# Patient Record
Sex: Male | Born: 1955 | Race: Black or African American | Hispanic: No | Smoking: Former smoker
Health system: Southern US, Community
[De-identification: ages and names within clinical notes are randomized; demographics above are authoritative.]

## PROBLEM LIST (undated history)

## (undated) DIAGNOSIS — E7800 Pure hypercholesterolemia, unspecified: Secondary | ICD-10-CM

## (undated) DIAGNOSIS — N529 Male erectile dysfunction, unspecified: Secondary | ICD-10-CM

## (undated) DIAGNOSIS — G5600 Carpal tunnel syndrome, unspecified upper limb: Secondary | ICD-10-CM

## (undated) DIAGNOSIS — I10 Essential (primary) hypertension: Secondary | ICD-10-CM

## (undated) DIAGNOSIS — K219 Gastro-esophageal reflux disease without esophagitis: Secondary | ICD-10-CM

## (undated) HISTORY — DX: Male erectile dysfunction, unspecified: N52.9

## (undated) HISTORY — PX: UMBILICAL HERNIA REPAIR: SHX196

## (undated) HISTORY — DX: Pure hypercholesterolemia, unspecified: E78.00

## (undated) HISTORY — DX: Gastro-esophageal reflux disease without esophagitis: K21.9

## (undated) HISTORY — DX: Carpal tunnel syndrome, unspecified upper limb: G56.00

## (undated) HISTORY — DX: Essential (primary) hypertension: I10

---

## 2002-07-09 ENCOUNTER — Encounter: Payer: Self-pay | Admitting: Emergency Medicine

## 2002-07-09 ENCOUNTER — Emergency Department (HOSPITAL_COMMUNITY): Admission: EM | Admit: 2002-07-09 | Discharge: 2002-07-09 | Payer: Self-pay | Admitting: Emergency Medicine

## 2002-07-20 ENCOUNTER — Emergency Department (HOSPITAL_COMMUNITY): Admission: EM | Admit: 2002-07-20 | Discharge: 2002-07-20 | Payer: Self-pay | Admitting: Emergency Medicine

## 2006-07-12 ENCOUNTER — Ambulatory Visit: Payer: Self-pay | Admitting: Internal Medicine

## 2006-07-22 ENCOUNTER — Ambulatory Visit: Payer: Self-pay | Admitting: Internal Medicine

## 2006-08-11 ENCOUNTER — Ambulatory Visit: Payer: Self-pay | Admitting: Internal Medicine

## 2006-09-29 ENCOUNTER — Ambulatory Visit (HOSPITAL_BASED_OUTPATIENT_CLINIC_OR_DEPARTMENT_OTHER): Admission: RE | Admit: 2006-09-29 | Discharge: 2006-09-29 | Payer: Self-pay | Admitting: Orthopedic Surgery

## 2006-11-23 HISTORY — PX: TOTAL HIP REVISION: SHX763

## 2007-02-24 ENCOUNTER — Encounter: Payer: Self-pay | Admitting: Internal Medicine

## 2007-02-24 ENCOUNTER — Ambulatory Visit: Payer: Self-pay | Admitting: Internal Medicine

## 2007-02-24 DIAGNOSIS — Z9889 Other specified postprocedural states: Secondary | ICD-10-CM

## 2007-02-24 DIAGNOSIS — K219 Gastro-esophageal reflux disease without esophagitis: Secondary | ICD-10-CM

## 2007-03-11 ENCOUNTER — Encounter: Payer: Self-pay | Admitting: Internal Medicine

## 2007-03-11 ENCOUNTER — Ambulatory Visit: Payer: Self-pay | Admitting: Internal Medicine

## 2007-03-25 ENCOUNTER — Ambulatory Visit: Payer: Self-pay | Admitting: Internal Medicine

## 2007-03-25 ENCOUNTER — Encounter: Payer: Self-pay | Admitting: *Deleted

## 2007-04-08 ENCOUNTER — Ambulatory Visit: Payer: Self-pay | Admitting: Internal Medicine

## 2007-04-08 ENCOUNTER — Encounter: Payer: Self-pay | Admitting: Internal Medicine

## 2007-04-15 ENCOUNTER — Encounter: Payer: Self-pay | Admitting: Internal Medicine

## 2007-06-13 ENCOUNTER — Ambulatory Visit: Payer: Self-pay | Admitting: Family Medicine

## 2007-06-14 LAB — CONVERTED CEMR LAB
BUN: 12 mg/dL (ref 6–23)
CO2: 26 meq/L (ref 19–32)
Calcium: 9.5 mg/dL (ref 8.4–10.5)
Chloride: 105 meq/L (ref 96–112)
Creatinine, Ser: 1 mg/dL (ref 0.4–1.5)
GFR calc Af Amer: 101 mL/min
GFR calc non Af Amer: 84 mL/min
Glucose, Bld: 91 mg/dL (ref 70–99)
Potassium: 4 meq/L (ref 3.5–5.1)
Sodium: 137 meq/L (ref 135–145)

## 2007-06-15 ENCOUNTER — Telehealth (INDEPENDENT_AMBULATORY_CARE_PROVIDER_SITE_OTHER): Payer: Self-pay | Admitting: Family Medicine

## 2007-06-15 ENCOUNTER — Encounter (INDEPENDENT_AMBULATORY_CARE_PROVIDER_SITE_OTHER): Payer: Self-pay | Admitting: *Deleted

## 2007-06-24 ENCOUNTER — Encounter: Admission: RE | Admit: 2007-06-24 | Discharge: 2007-06-24 | Payer: Self-pay | Admitting: Orthopedic Surgery

## 2007-06-28 ENCOUNTER — Telehealth (INDEPENDENT_AMBULATORY_CARE_PROVIDER_SITE_OTHER): Payer: Self-pay | Admitting: *Deleted

## 2007-07-11 ENCOUNTER — Telehealth (INDEPENDENT_AMBULATORY_CARE_PROVIDER_SITE_OTHER): Payer: Self-pay | Admitting: *Deleted

## 2007-07-20 ENCOUNTER — Ambulatory Visit: Payer: Self-pay | Admitting: Family Medicine

## 2007-07-22 ENCOUNTER — Telehealth (INDEPENDENT_AMBULATORY_CARE_PROVIDER_SITE_OTHER): Payer: Self-pay | Admitting: *Deleted

## 2007-08-11 ENCOUNTER — Ambulatory Visit: Payer: Self-pay | Admitting: Family Medicine

## 2007-08-11 LAB — CONVERTED CEMR LAB
Creatinine, Ser: 1.2 mg/dL (ref 0.4–1.5)
GFR calc non Af Amer: 68 mL/min
Glucose, Bld: 106 mg/dL — ABNORMAL HIGH (ref 70–99)
Potassium: 4.4 meq/L (ref 3.5–5.1)
Sodium: 141 meq/L (ref 135–145)

## 2007-08-15 ENCOUNTER — Telehealth (INDEPENDENT_AMBULATORY_CARE_PROVIDER_SITE_OTHER): Payer: Self-pay | Admitting: *Deleted

## 2007-08-22 ENCOUNTER — Telehealth (INDEPENDENT_AMBULATORY_CARE_PROVIDER_SITE_OTHER): Payer: Self-pay | Admitting: *Deleted

## 2007-08-26 ENCOUNTER — Ambulatory Visit: Payer: Self-pay | Admitting: Family Medicine

## 2007-09-09 ENCOUNTER — Ambulatory Visit: Payer: Self-pay | Admitting: Family Medicine

## 2007-09-12 ENCOUNTER — Ambulatory Visit: Payer: Self-pay | Admitting: Family Medicine

## 2007-09-13 ENCOUNTER — Telehealth (INDEPENDENT_AMBULATORY_CARE_PROVIDER_SITE_OTHER): Payer: Self-pay | Admitting: *Deleted

## 2007-09-13 ENCOUNTER — Encounter (INDEPENDENT_AMBULATORY_CARE_PROVIDER_SITE_OTHER): Payer: Self-pay | Admitting: Family Medicine

## 2007-09-13 LAB — CONVERTED CEMR LAB
BUN: 15 mg/dL (ref 6–23)
CO2: 30 meq/L (ref 19–32)
Creatinine, Ser: 1.2 mg/dL (ref 0.4–1.5)
GFR calc Af Amer: 82 mL/min
Potassium: 3.9 meq/L (ref 3.5–5.1)
Sodium: 143 meq/L (ref 135–145)

## 2007-09-19 ENCOUNTER — Inpatient Hospital Stay (HOSPITAL_COMMUNITY): Admission: RE | Admit: 2007-09-19 | Discharge: 2007-09-21 | Payer: Self-pay | Admitting: Orthopedic Surgery

## 2007-11-18 ENCOUNTER — Telehealth (INDEPENDENT_AMBULATORY_CARE_PROVIDER_SITE_OTHER): Payer: Self-pay | Admitting: *Deleted

## 2007-11-30 ENCOUNTER — Telehealth (INDEPENDENT_AMBULATORY_CARE_PROVIDER_SITE_OTHER): Payer: Self-pay | Admitting: *Deleted

## 2008-06-07 ENCOUNTER — Ambulatory Visit: Payer: Self-pay | Admitting: *Deleted

## 2008-06-07 DIAGNOSIS — E7800 Pure hypercholesterolemia, unspecified: Secondary | ICD-10-CM

## 2008-06-07 DIAGNOSIS — I10 Essential (primary) hypertension: Secondary | ICD-10-CM | POA: Insufficient documentation

## 2008-07-09 ENCOUNTER — Ambulatory Visit: Payer: Self-pay | Admitting: *Deleted

## 2008-07-09 DIAGNOSIS — F528 Other sexual dysfunction not due to a substance or known physiological condition: Secondary | ICD-10-CM | POA: Insufficient documentation

## 2008-07-18 ENCOUNTER — Telehealth (INDEPENDENT_AMBULATORY_CARE_PROVIDER_SITE_OTHER): Payer: Self-pay | Admitting: *Deleted

## 2008-07-31 IMAGING — CR DG PORTABLE PELVIS
1 series · 1 of 1 positions shown · non-contrast
Comparison: None.

CLINICAL DATA: Right hip replacement.  
 PORTABLE PELVIS ? 1 VIEW:

[view not recorded]
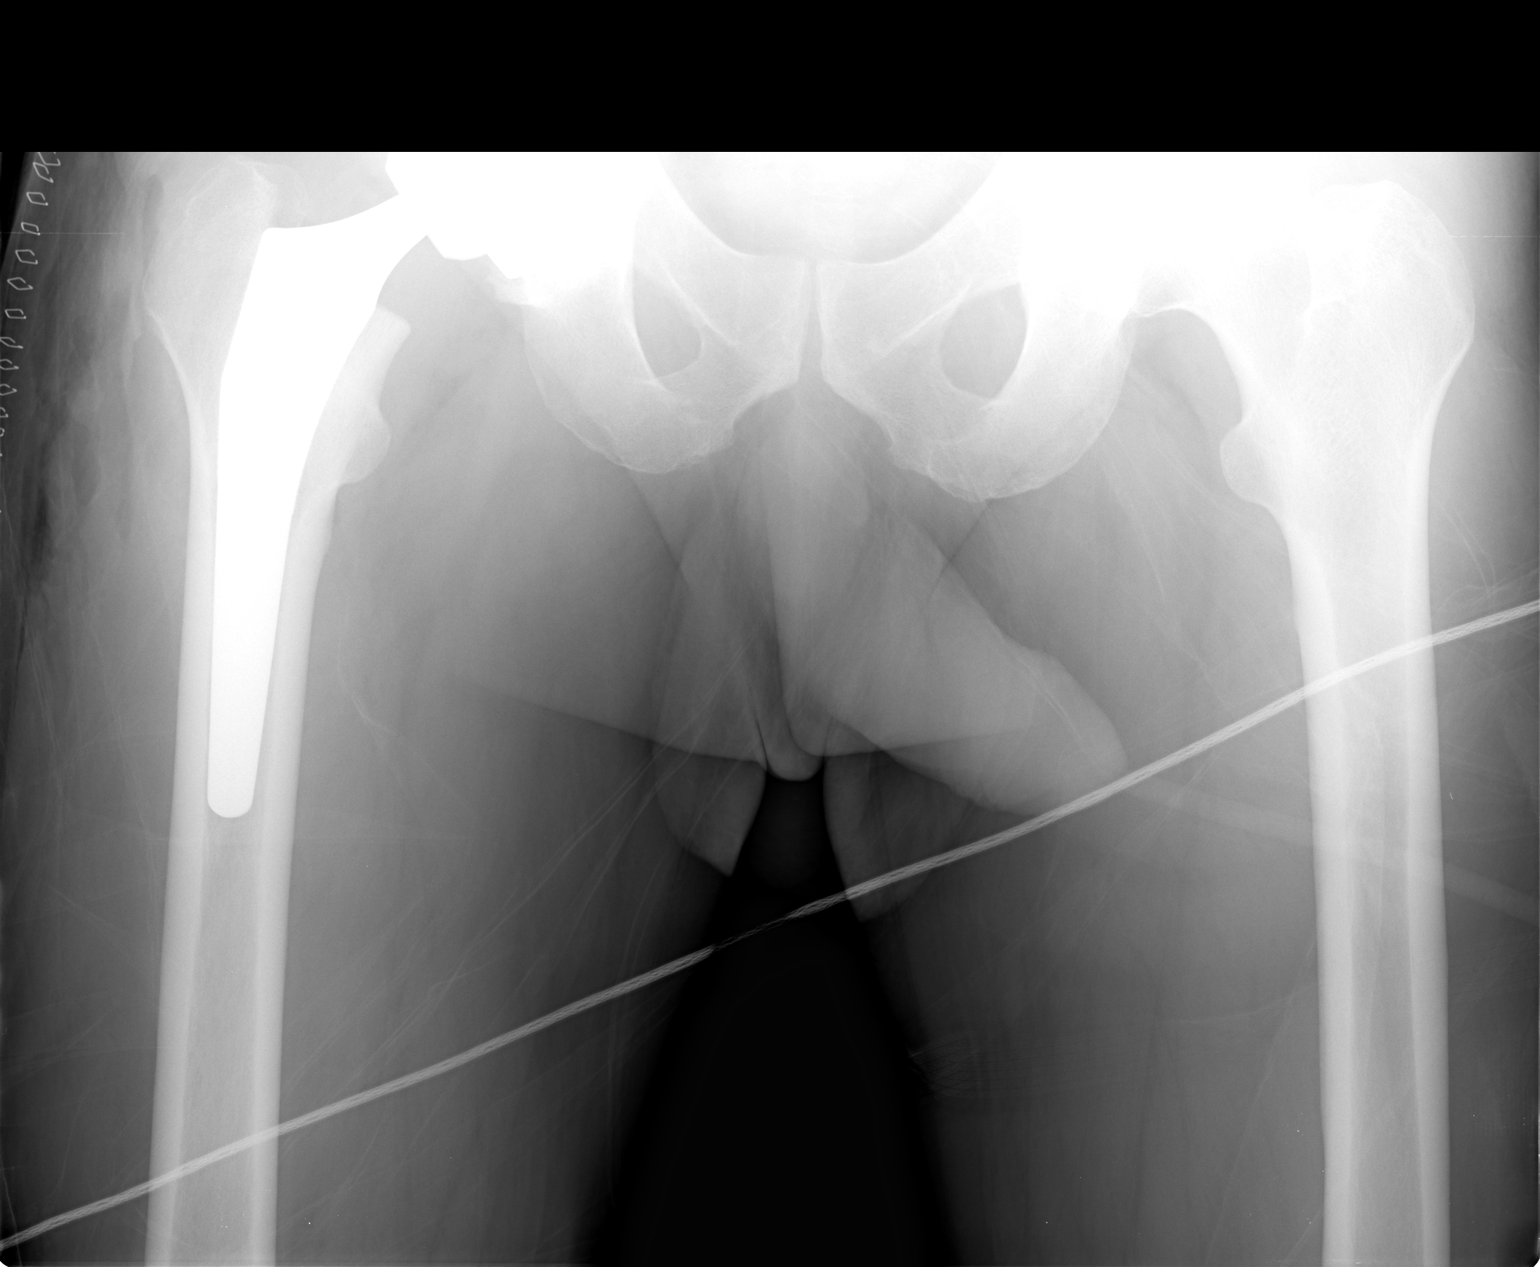

[1 of 1 positions shown; findings below may reference images not displayed]

FINDINGS: Patient is status-post right total hip arthroplasty.  Overlying subcutaneous emphysema.
IMPRESSION: Right total hip arthroplasty without immediate complicating feature.

## 2008-07-31 IMAGING — CR DG HIP 1V PORT*R*
1 series · 1 of 1 positions shown · non-contrast
Comparison: None.

CLINICAL DATA: Right hip replacement. 
 PORTABLE RIGHT HIP ? 1 VIEW:

[view not recorded]
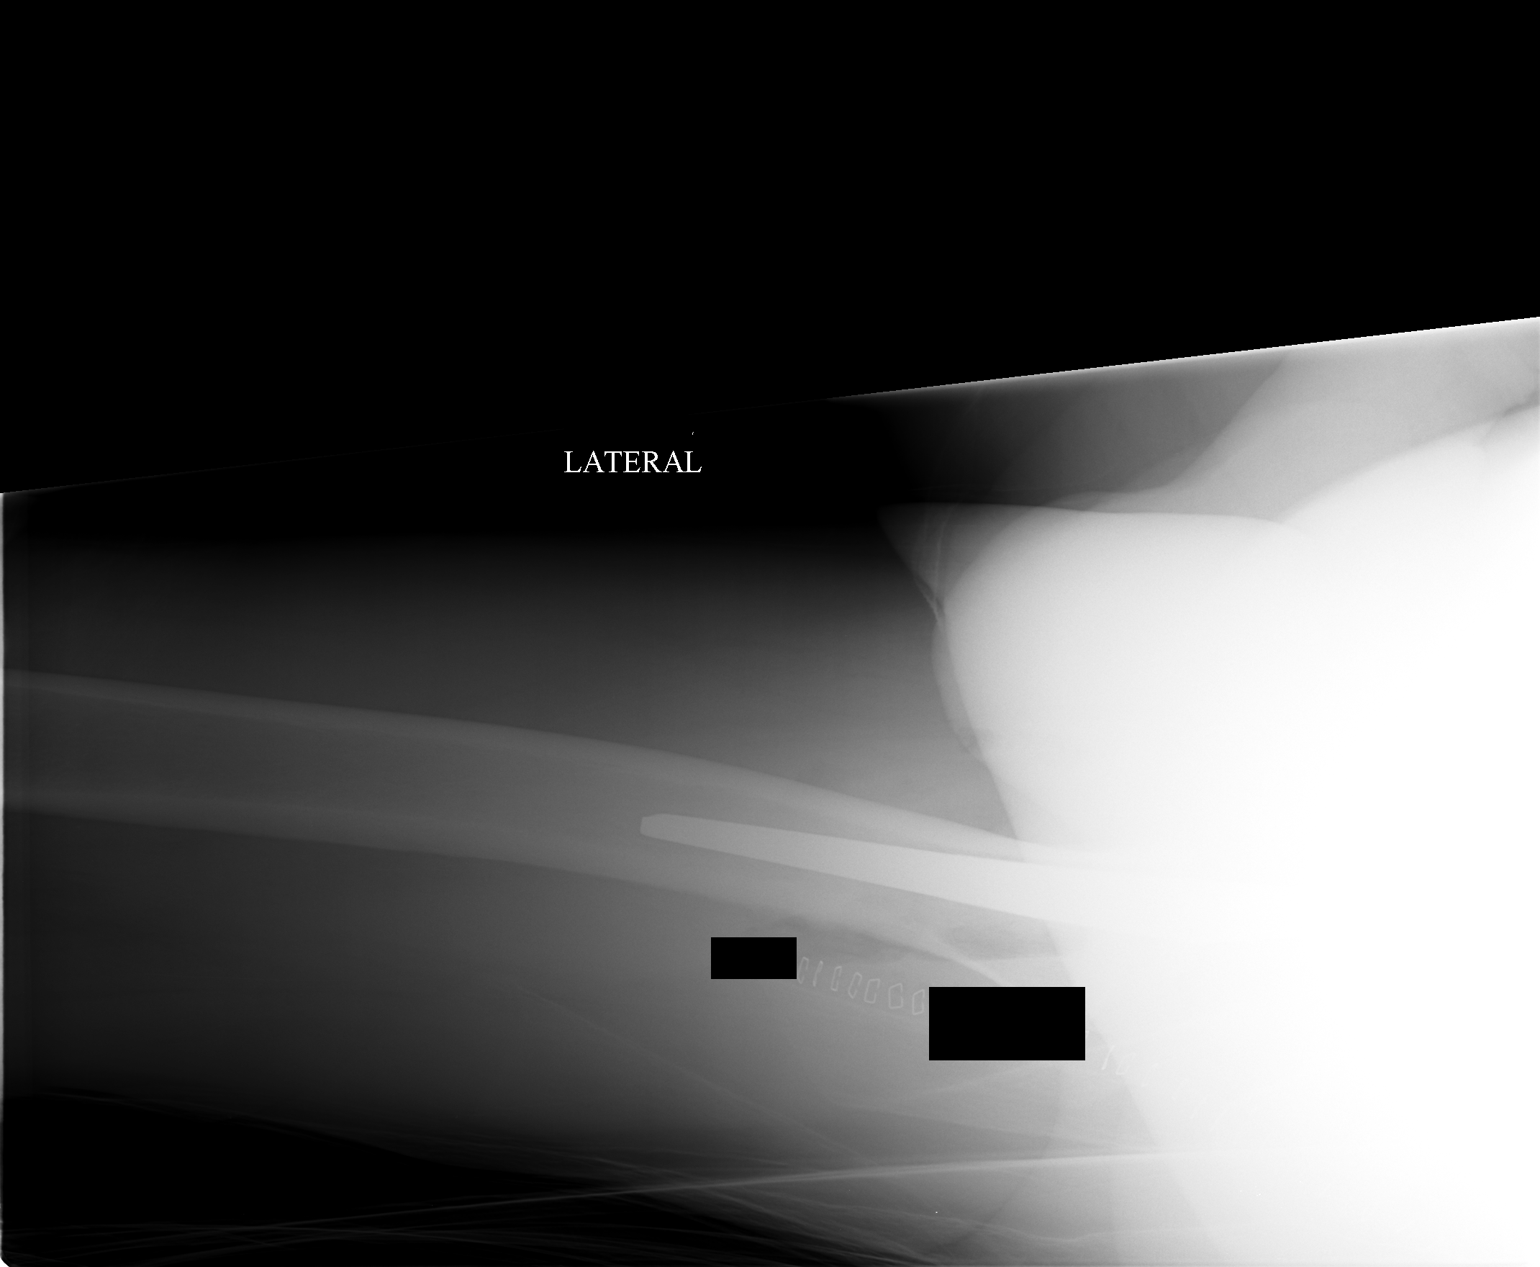

[1 of 1 positions shown; findings below may reference images not displayed]

FINDINGS: Patient is status-post right total hip arthroplasty.  The femoral stem is visualized with overlying subcutaneous emphysema.  The right hip is obscured by superimposed soft tissue.
IMPRESSION: Please see above.

## 2008-08-09 ENCOUNTER — Ambulatory Visit: Payer: Self-pay | Admitting: *Deleted

## 2008-10-12 ENCOUNTER — Ambulatory Visit: Payer: Self-pay | Admitting: *Deleted

## 2008-10-12 DIAGNOSIS — G5600 Carpal tunnel syndrome, unspecified upper limb: Secondary | ICD-10-CM

## 2008-11-21 ENCOUNTER — Ambulatory Visit: Payer: Self-pay | Admitting: *Deleted

## 2008-12-17 ENCOUNTER — Telehealth (INDEPENDENT_AMBULATORY_CARE_PROVIDER_SITE_OTHER): Payer: Self-pay | Admitting: *Deleted

## 2009-03-01 ENCOUNTER — Ambulatory Visit: Payer: Self-pay | Admitting: *Deleted

## 2009-03-01 LAB — CONVERTED CEMR LAB
ALT: 39 units/L (ref 0–53)
Albumin: 4.3 g/dL (ref 3.5–5.2)
Basophils Absolute: 0 10*3/uL (ref 0.0–0.1)
CO2: 28 meq/L (ref 19–32)
Calcium: 9.4 mg/dL (ref 8.4–10.5)
Chloride: 106 meq/L (ref 96–112)
Eosinophils Absolute: 0.1 10*3/uL (ref 0.0–0.7)
GFR calc non Af Amer: 81.3 mL/min (ref >60)
Glucose, Bld: 118 mg/dL — ABNORMAL HIGH (ref 70–99)
Lymphocytes Relative: 42.7 % (ref 12.0–46.0)
MCHC: 35 g/dL (ref 30.0–36.0)
Monocytes Relative: 9.2 % (ref 3.0–12.0)
Neutro Abs: 1.8 10*3/uL (ref 1.4–7.7)
Neutrophils Relative %: 45.8 % (ref 43.0–77.0)
Platelets: 191 10*3/uL (ref 150.0–400.0)
RDW: 13.2 % (ref 11.5–14.6)
Sodium: 142 meq/L (ref 135–145)
TSH: 1.74 microintl units/mL (ref 0.35–5.50)
Total Bilirubin: 1.6 mg/dL — ABNORMAL HIGH (ref 0.3–1.2)
Total CHOL/HDL Ratio: 5
Total Protein: 7.5 g/dL (ref 6.0–8.3)
Triglycerides: 148 mg/dL (ref 0.0–149.0)

## 2009-03-04 ENCOUNTER — Encounter: Payer: Self-pay | Admitting: Internal Medicine

## 2009-06-04 ENCOUNTER — Telehealth (INDEPENDENT_AMBULATORY_CARE_PROVIDER_SITE_OTHER): Payer: Self-pay | Admitting: *Deleted

## 2009-06-06 ENCOUNTER — Telehealth (INDEPENDENT_AMBULATORY_CARE_PROVIDER_SITE_OTHER): Payer: Self-pay | Admitting: *Deleted

## 2010-12-18 ENCOUNTER — Ambulatory Visit (INDEPENDENT_AMBULATORY_CARE_PROVIDER_SITE_OTHER)
Admission: RE | Admit: 2010-12-18 | Discharge: 2010-12-18 | Payer: BC Managed Care – PPO | Source: Home / Self Care | Attending: Internal Medicine | Admitting: Internal Medicine

## 2010-12-18 DIAGNOSIS — I10 Essential (primary) hypertension: Secondary | ICD-10-CM

## 2010-12-21 LAB — CONVERTED CEMR LAB
CO2: 28 meq/L (ref 19–32)
GFR calc Af Amer: 91 mL/min
GFR calc non Af Amer: 75 mL/min
Glucose, Bld: 102 mg/dL — ABNORMAL HIGH (ref 70–99)
Sodium: 140 meq/L (ref 135–145)

## 2011-01-08 NOTE — Assessment & Plan Note (Signed)
Summary: NEW TO EST [employer] /MHF   Vital Signs:  Patient profile:   55 year old male Height:      72.25 inches Weight:      249.50 pounds BMI:     33.73 O2 Sat:      99 % on Room air Temp:     98.6 degrees F oral Pulse rate:   66 / minute Resp:     18 per minute BP sitting:   176 / 80  (left arm)  Vitals Entered By: Glendell Docker CMA (December 18, 2010 1:13 PM)  O2 Flow:  Room air CC: New Patient  Is Patient Diabetic? No Pain Assessment Patient in pain? no      Comments rx for blood pressure medication, states he was without insurance for a year and could not afford his medication, so he has not had any medication for the past year blood pressure in left arm 168/100   Primary Care Provider:  Dondra Spry DO  CC:  New Patient .  History of Present Illness: 55 y/o male for f/u didnt have ins  in between jobs  ringing in the ears at first only bothered pt at night not getting worse denies sign change in hearing   Preventive Screening-Counseling & Management  Alcohol-Tobacco     Alcohol drinks/day: <1     Smoking Status: quit  Caffeine-Diet-Exercise     Caffeine use/day: 3-4 times per week     Does Patient Exercise: no  Allergies (verified): No Known Drug Allergies  Past History:  Past Medical History: HYPERCHOLESTEROLEMIA (ICD-272.0) GERD (ICD-530.81) HYPERTENSION - treated x 2008-2009 erectile dysfunction carpal tunnel  Past Surgical History: Total hip replacement 2008 Umbilical hernia as a baby   Social History: 5 children - grown Single - Divorced 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  nocturia x 2   Physical Exam  General:  alert and overweight-appearing.   Head:   normocephalic and atraumatic.   Eyes:  pupils equal, pupils round, and pupils reactive to light.   Ears:  R ear normal and L ear normal.   Mouth:  Oral mucosa and oropharynx without lesions or exudates.   Neck:  No deformities, masses, or tenderness noted. Lungs:  Normal respiratory effort, chest expands symmetrically. Lungs are clear to auscultation, no crackles or wheezes. Heart:  Normal rate and regular rhythm. S1 and S2 normal without gallop, murmur, click, rub or other extra sounds. Abdomen:  soft, non-tender, normal bowel sounds, no masses, no hepatomegaly, and no splenomegaly.   Extremities:  trace left pedal edema and trace right pedal edema.   Neurologic:  cranial nerves II-XII intact and gait normal.   Psych:  normally interactive, good eye contact, not anxious appearing, and not depressed appearing.     Impression & Recommendations:  Problem # 1:  HYPERTENSION (ICD-401.9)  hasn't taken meds x 1 year we discussed risks of untreated htn restart benicar/hct  The following medications were removed from the medication list:    Benicar Hct 40-25 Mg Tabs (Olmesartan medoxomil-hctz) .Marland Kitchen... 1 by mouth qd His updated medication list for this problem includes:    Benicar Hct 40-25 Mg Tabs (Olmesartan medoxomil-hctz) ..... One by mouth once daily  BP today: 176/80 Prior  BP: 118/80 (03/01/2009)  Labs Reviewed: K+: 4.2 (03/01/2009) Creat: : 1.2 (03/01/2009)   Chol: 230 (03/01/2009)   HDL: 45.10 (03/01/2009)   TG: 148.0 (03/01/2009)  Complete Medication List: 1)  Benicar Hct 40-25 Mg Tabs (Olmesartan medoxomil-hctz) .... One by mouth once daily  Patient Instructions: 1)  Please schedule a follow-up appointment in 1 month. 2)  BMP prior to visit, ICD-9:  401.9 3)  Hepatic Panel prior to visit, ICD-9: 272.4 4)  Lipid Panel prior to visit, ICD-9: 272.4 5)  TSH prior to visit, ICD-9: 401.9 6)  PSA prior to visit, ICD-9: v76.44 7)  HbgA1C prior to visit, ICD-9: 790.29 8)  Please  return for lab work one (1) week before your next appointment.                                                                              Prescriptions: BENICAR HCT 40-25 MG TABS (OLMESARTAN MEDOXOMIL-HCTZ) one by mouth once daily  #90 x 1   Entered and Authorized by:   D. Thomos Lemons DO   Signed by:   D. Thomos Lemons DO on 12/18/2010   Method used:   Print then Give to Patient   RxID:   [id number]    Orders Added: 1)  New Patient Level III [99203]   Immunization History:  Influenza Immunization History:    Influenza:  declined (12/11/2010)   Contraindications/Deferment of Procedures/Staging:    Test/Procedure: FLU VAX    Reason for deferment: patient declined   Immunization History:  Influenza Immunization History:    Influenza:  Declined (12/11/2010)  Current Allergies (reviewed today): No known allergies

## 2011-01-23 ENCOUNTER — Encounter: Payer: Self-pay | Admitting: Internal Medicine

## 2011-01-23 ENCOUNTER — Ambulatory Visit (INDEPENDENT_AMBULATORY_CARE_PROVIDER_SITE_OTHER): Payer: BC Managed Care – PPO | Admitting: Internal Medicine

## 2011-01-23 DIAGNOSIS — I10 Essential (primary) hypertension: Secondary | ICD-10-CM

## 2011-01-23 DIAGNOSIS — R7309 Other abnormal glucose: Secondary | ICD-10-CM | POA: Insufficient documentation

## 2011-01-23 LAB — CONVERTED CEMR LAB
ALT: 29 units/L (ref 0–53)
AST: 25 units/L (ref 0–37)
Bilirubin, Direct: 0.2 mg/dL (ref 0.0–0.3)
Calcium: 9.1 mg/dL (ref 8.4–10.5)
Cholesterol: 179 mg/dL (ref 0–200)
Glucose, Bld: 95 mg/dL (ref 70–99)
Indirect Bilirubin: 0.9 mg/dL (ref 0.0–0.9)
Potassium: 3.9 meq/L (ref 3.5–5.3)
Sodium: 140 meq/L (ref 135–145)
TSH: 2.05 microintl units/mL (ref 0.350–4.500)
Total CHOL/HDL Ratio: 4.7
Total Protein: 7.5 g/dL (ref 6.0–8.3)
Triglycerides: 145 mg/dL (ref <150)
VLDL: 29 mg/dL (ref 0–40)

## 2011-01-26 ENCOUNTER — Telehealth: Payer: Self-pay | Admitting: Internal Medicine

## 2011-01-26 ENCOUNTER — Encounter: Payer: Self-pay | Admitting: Internal Medicine

## 2011-01-27 ENCOUNTER — Encounter: Payer: Self-pay | Admitting: Internal Medicine

## 2011-01-29 ENCOUNTER — Ambulatory Visit (INDEPENDENT_AMBULATORY_CARE_PROVIDER_SITE_OTHER): Payer: BC Managed Care – PPO | Admitting: Internal Medicine

## 2011-01-29 ENCOUNTER — Encounter: Payer: Self-pay | Admitting: Internal Medicine

## 2011-01-29 DIAGNOSIS — M2550 Pain in unspecified joint: Secondary | ICD-10-CM | POA: Insufficient documentation

## 2011-01-29 DIAGNOSIS — M79609 Pain in unspecified limb: Secondary | ICD-10-CM

## 2011-01-29 LAB — CONVERTED CEMR LAB: Total CK: 237 units/L — ABNORMAL HIGH (ref 7–232)

## 2011-01-30 ENCOUNTER — Telehealth: Payer: Self-pay | Admitting: Internal Medicine

## 2011-02-03 NOTE — Progress Notes (Signed)
Summary: Lab results  Phone Note Outgoing Call   Summary of Call: call pt - labs normal  (muscle enzymes, sed rate - marker of inflammation) Initial call taken by: D. Thomos Lemons DO,  January 30, 2011 8:21 AM  Follow-up for Phone Call        call placed to patient at [phone], he was informed per Dr Artist Pais instructions. Follow-up by: Glendell Docker CMA,  January 30, 2011 8:55 AM

## 2011-02-03 NOTE — Letter (Signed)
   [facility] [address] [place], [place]  [address]  [place] Phone: [phone]      January 26, 2011   [facility] [address] [place], [place] [address]  RE:  LAB RESULTS  Dear  Mr. Abed,  The following is an interpretation of your most recent lab tests.  Please take note of any instructions provided or changes to medications that have resulted from your lab work.  PSA:  normal - no follow-up needed PSA: 1.82  ELECTROLYTES:  Good - no changes needed  KIDNEY FUNCTION TESTS:  Good - no changes needed  LIVER FUNCTION TESTS:  Good - no changes needed  LIPID PANEL:  Fair - review at your next visit Triglyceride: 145   Cholesterol: 179   LDL: 112   HDL: 38   Chol/HDL%:  4.7 Ratio  THYROID STUDIES:  Thyroid studies normal TSH: 2.050     DIABETIC STUDIES:  Fair - schedule a follow-up appointment Blood Glucose: 95   HgbA1C: 6.4          Sincerely Yours,    Dr. Thomos Lemons  Appended Document:  mailed

## 2011-02-03 NOTE — Progress Notes (Signed)
Summary: Lab Results  Phone Note Outgoing Call   Summary of Call: call pt - blood test shows pt is borderline diabetic.  please advise patient to avoid concentrated sweets (soft drinks, fruit juices, desserts) he should try to limit his carbohydrate intake to 30 g per meal I will send letter re:  other lab results  please ask pt whether he would like to see diabetic educator Initial call taken by: D. Thomos Lemons DO,  January 26, 2011 1:00 PM  Follow-up for Phone Call        call placed to patient at [phone], he was informed per Dr Artist Pais instructions. Patient states that he does not really eat any sweets, and he is interested in seeing a diabetes educator Follow-up by: Glendell Docker CMA,  January 27, 2011 8:55 AM  Additional Follow-up for Phone Call Additional follow up Details #1::        patient aware he will be contact regarding referral to diabetes educator Additional Follow-up by: Glendell Docker CMA,  January 27, 2011 2:53 PM

## 2011-02-10 NOTE — Letter (Signed)
Summary: Order for Nutrition & Diabetes Services  Order for Nutrition & Diabetes Services   Imported By: Maryln Gottron 02/05/2011 13:07:04  _____________________________________________________________________  External Attachment:    Type:   Image     Comment:   External Document

## 2011-02-10 NOTE — Assessment & Plan Note (Signed)
Summary: 1 month follow up/mhf   Vital Signs:  Patient profile:   55 year old male Height:      72.25 inches Weight:      244 pounds BMI:     32.98 O2 Sat:      99 % on Room air Temp:     98.4 degrees F oral Pulse rate:   65 / minute Resp:     18 per minute BP sitting:   142 / 80  (right arm) Cuff size:   large  Vitals Entered By: Glendell Docker CMA (January 23, 2011 3:08 PM)  O2 Flow:  Room air CC: 1 Month Follow up Is Patient Diabetic? No Pain Assessment Patient in pain? no      Comments fasting for blood work , c/o body aches and leg discomfort since starting blood pressure medication   Primary Care Provider:  Dondra Spry DO  CC:  1 Month Follow up.  History of Present Illness: 55 y/o male for f/u re: htn complains of body aches not sure if symptoms related to BP medication pt on benicar /hctz     Allergies (verified): No Known Drug Allergies  Past History:  Past Medical History: HYPERCHOLESTEROLEMIA (ICD-272.0) GERD (ICD-530.81) HYPERTENSION - treated x 2008-2009 erectile dysfunction   carpal tunnel  Past Surgical History: Total hip replacement 2008 Umbilical hernia as a baby    Physical Exam  General:  alert, well-developed, and well-nourished.   Lungs:  Normal respiratory effort, chest expands symmetrically. Lungs are clear to auscultation, no crackles or wheezes. Heart:  Normal rate and regular rhythm. S1 and S2 normal without gallop, murmur, click, rub or other extra sounds. Neurologic:  cranial nerves II-XII intact and gait normal.     Impression & Recommendations:  Problem # 1:  HYPERTENSION (ICD-401.9) Assessment Improved  muscle aches, cramps may be from diuretic.  change to azor  The following medications were removed from the medication list:    Benicar Hct 40-25 Mg Tabs (Olmesartan medoxomil-hctz) ..... One by mouth once daily His updated medication list for this problem includes:    Azor 5-40 Mg Tabs (Amlodipine-olmesartan)  ..... One by mouth pd  Orders: T-Basic Metabolic Panel [id number]) T-Hepatic Function [id number]) T-Lipid Profile [id number]) T-TSH [phone])  BP today: 142/80 Prior BP: 176/80 (12/18/2010)  Labs Reviewed: K+: 4.2 (03/01/2009) Creat: : 1.2 (03/01/2009)   Chol: 230 (03/01/2009)   HDL: 45.10 (03/01/2009)   TG: 148.0 (03/01/2009)  Complete Medication List: 1)  Prevacid 24hr 15 Mg Cpdr (Lansoprazole) .... Take 1 capsule by mouth once a day 2)  Fish Oil 1000 Mg Caps (Omega-3 fatty acids) .... Take 1 capsule by mouth once a day 3)  Azor 5-40 Mg Tabs (Amlodipine-olmesartan) .... One by mouth pd 4)  Hydrocodone-acetaminophen 5-500 Mg Tabs (Hydrocodone-acetaminophen) .... One to two tabs by mouth two times a day as needed  Other Orders: T- Hemoglobin A1C ([id number]) T-PSA ([id number])  Patient Instructions: 1)  Please schedule a follow-up appointment in 6 weeks. Prescriptions: AZOR 5-40 MG TABS (AMLODIPINE-OLMESARTAN) one by mouth pd  #90 x 1   Entered and Authorized by:   D. Thomos Lemons DO   Signed by:   D. Thomos Lemons DO on 01/23/2011   Method used:   Print then Give to Patient   RxID:   [id number]    Orders Added: 1)  T-Basic Metabolic Panel [[id number]] 2)  T-Hepatic Function [[id number]] 3)  T-Lipid Profile [[id number]] 4)  T- Hemoglobin A1C [83036-23375] 5)  T-TSH [28315-17616]  6)  T-PSA [[address]] 7)  Est. Patient Level III [09811]    Current Allergies (reviewed today): No known allergies

## 2011-02-11 ENCOUNTER — Encounter: Payer: BC Managed Care – PPO | Attending: Internal Medicine | Admitting: *Deleted

## 2011-02-11 DIAGNOSIS — Z713 Dietary counseling and surveillance: Secondary | ICD-10-CM | POA: Insufficient documentation

## 2011-02-11 DIAGNOSIS — R7309 Other abnormal glucose: Secondary | ICD-10-CM | POA: Insufficient documentation

## 2011-02-19 NOTE — Assessment & Plan Note (Signed)
Summary: body aches/mhf   Vital Signs:  Patient profile:   55 year old male Height:      72.25 inches O2 Sat:      98 % on Room air Temp:     98.6 degrees F oral Pulse rate:   70 / minute Resp:     20 per minute BP sitting:   146 / 86  (right arm) Cuff size:   large  Vitals Entered By: Glendell Docker CMA (January 29, 2011 9:58 AM)  O2 Flow:  Room air CC: Joint aches Is Patient Diabetic? No Pain Assessment Patient in pain? no        Primary Care Provider:  Dondra Spry DO  CC:  Joint aches.  History of Present Illness: 55 y/o male c/o feeling achey symptoms worse on left side,  particularly left leg feels AM stiffness sometimes worse at night,  unable to sleep sometimes due to pain no joint swelling or redness  hx of right hip replacement no fevers    Preventive Screening-Counseling & Management  Alcohol-Tobacco     Smoking Status: quit  Allergies (verified): No Known Drug Allergies  Past History:  Past Medical History: HYPERCHOLESTEROLEMIA (ICD-272.0) GERD (ICD-530.81) HYPERTENSION - treated x 2008-2009 erectile dysfunction carpal tunnel    Past Surgical History: Total hip replacement 2008 Umbilical hernia as a baby    Physical Exam  General:  alert, well-developed, and well-nourished.   Lungs:  normal respiratory effort and normal breath sounds.   Heart:  normal rate, regular rhythm, and no gallop.   Msk:  no joint tenderness, no joint swelling, no joint warmth, and no redness over joints.    mild tenderness of bilateral deltoids Extremities:  trace left pedal edema and trace right pedal edema.   Neurologic:  cranial nerves II-XII intact and DTRs symmetrical and normal.     Impression & Recommendations:  Problem # 1:  LEG PAIN, LEFT (ICD-729.5) pt c/o bilateral lower ext pain.  left is much worse than right I suspect pain may be attributable to lumbar radiculopathy  use pain meds and perform stretching exercises as directed if  symptoms do not improve, consider MRI of LS spine  Problem # 2:  ARTHRALGIA (ICD-719.40) pt also c/o mild bilateral shoulder pain rule out rhabdo rule out PMR  Orders: T-CK Total ([id number]) T-Sed Rate (Automated) (09811-91478)  Problem # 3:  HYPERTENSION (ICD-401.9)  His updated medication list for this problem includes:    Azor 5-40 Mg Tabs (Amlodipine-olmesartan) ..... One by mouth pd  Complete Medication List: 1)  Prevacid 24hr 15 Mg Cpdr (Lansoprazole) .... Take 1 capsule by mouth once a day 2)  Fish Oil 1000 Mg Caps (Omega-3 fatty acids) .... Take 1 capsule by mouth once a day 3)  Azor 5-40 Mg Tabs (Amlodipine-olmesartan) .... One by mouth pd 4)  Hydrocodone-acetaminophen 5-500 Mg Tabs (Hydrocodone-acetaminophen) .... One to two tabs by mouth two times a day as needed  Patient Instructions: 1)  Please schedule a follow-up appointment in 1 month. 2)  Call our office if your symptoms do not  improve or gets worse. Prescriptions: HYDROCODONE-ACETAMINOPHEN 5-500 MG TABS (HYDROCODONE-ACETAMINOPHEN) one to two tabs by mouth two times a day as needed  #60 x 0   Entered and Authorized by:   D. Thomos Lemons DO   Signed by:   D. Thomos Lemons DO on 01/29/2011   Method used:   Print then Give to Patient   RxID:   [id number]  Orders Added: 1)  T-CK Total [82550-23250] 2)  T-Sed Rate (Automated) [44010-27253] 3)  Est. Patient Level III [66440]    Current Allergies (reviewed today): No known allergies

## 2011-03-12 ENCOUNTER — Telehealth: Payer: Self-pay | Admitting: *Deleted

## 2011-03-12 NOTE — Telephone Encounter (Signed)
Left message on machine for pt to return my call  

## 2011-03-12 NOTE — Telephone Encounter (Signed)
Received call from pt stating the Azor is too expensive, cost with insurance is $102. Pt is requesting a cheaper alternative. He is out of med and started back on Benicar HCT that he had left over. Please advise.

## 2011-03-12 NOTE — Telephone Encounter (Signed)
Please send separate rx for benicar 40 mg and amlodipine 5 mg

## 2011-03-13 ENCOUNTER — Ambulatory Visit: Payer: BC Managed Care – PPO | Admitting: Internal Medicine

## 2011-03-14 ENCOUNTER — Encounter: Payer: Self-pay | Admitting: Internal Medicine

## 2011-03-17 ENCOUNTER — Encounter: Payer: Self-pay | Admitting: Internal Medicine

## 2011-03-17 ENCOUNTER — Ambulatory Visit (INDEPENDENT_AMBULATORY_CARE_PROVIDER_SITE_OTHER): Payer: BC Managed Care – PPO | Admitting: Internal Medicine

## 2011-03-17 ENCOUNTER — Telehealth: Payer: Self-pay | Admitting: *Deleted

## 2011-03-17 VITALS — BP 128/80 | HR 63 | Temp 98.3°F | Resp 18 | Wt 243.0 lb

## 2011-03-17 DIAGNOSIS — I10 Essential (primary) hypertension: Secondary | ICD-10-CM

## 2011-03-17 DIAGNOSIS — R7309 Other abnormal glucose: Secondary | ICD-10-CM

## 2011-03-17 DIAGNOSIS — F528 Other sexual dysfunction not due to a substance or known physiological condition: Secondary | ICD-10-CM

## 2011-03-17 MED ORDER — AMLODIPINE BESYLATE 5 MG PO TABS: 5.0000 mg | ORAL_TABLET | Freq: Every day | ORAL | Status: DC

## 2011-03-17 MED ORDER — OLMESARTAN MEDOXOMIL 40 MG PO TABS: 40.0000 mg | ORAL_TABLET | Freq: Every day | ORAL | Status: DC

## 2011-03-17 MED ORDER — TADALAFIL 5 MG PO TABS: 5.0000 mg | ORAL_TABLET | Freq: Every day | ORAL | Status: DC | PRN

## 2011-03-17 NOTE — Telephone Encounter (Signed)
Pt notified and Rxs sent to pharmacy.

## 2011-03-17 NOTE — Telephone Encounter (Signed)
Please fax an order for labs to [facility] for July 17 appt for bmet a1c 790.29 401.9

## 2011-03-17 NOTE — Patient Instructions (Signed)
Please complete the following lab tests before your next follow up appointment: BMET, A1c - 790.29, 401.9 

## 2011-03-17 NOTE — Progress Notes (Signed)
  Subjective:    Patient ID: Joshua Little, male    DOB: 08-15-1956, 55 y.o.   MRN: [id number]  HPI 55 y/o male for follow up re:  htn Good med compliance but azor cost prohibitive Prev issues with myalgia resolved.  Likely due to transient viral infection    Review of Systems Negative for chest pain.  No dizziness  Past Medical History  Diagnosis Date  . Hypercholesterolemia   . GERD (gastroesophageal reflux disease)   . Hypertension   . ED (erectile dysfunction)   . Carpal tunnel syndrome     History   Social History  . Marital Status: Divorced    Spouse Name: N/A    Number of Children: N/A  . Years of Education: N/A   Occupational History  . Not on file.   Social History Main Topics  . Smoking status: Former Games developer  . Smokeless tobacco: Not on file   Comment: quit smoking in 2000-light smoker x 10 years  . Alcohol Use: Not on file  . Drug Use: Not on file  . Sexually Active: Not on file   Other Topics Concern  . Not on file   Social History Narrative   5 children - grownSingle -DivorcedOccupation: maintenance workerquit smoking 2000 - light smoker x 10 yrsAlcohol use-yes (3-4 drinks per week)Occupation: Maintenance supervisor for [employer]Caffeine use/day:  3-4 times per week    Past Surgical History  Procedure Date  . 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 Umbilical hernia repair     as a baby    Family History  Problem Relation Age of Onset  . Gout Father   . Diabetes Sister   . Other      no history of colon cancer, prostate cancer, coronary artery disease    No Known Allergies  Current Outpatient Prescriptions on File Prior to Visit  Medication Sig Dispense Refill  . amLODipine (NORVASC) 5 MG tablet Take 1 tablet (5 mg total) by mouth daily.  30 tablet  1  . HYDROcodone-acetaminophen (VICODIN) 5-500 MG per tablet Take 1 tablet by mouth 2 (two) times daily as needed.        . lansoprazole (PREVACID 24HR) 15 MG  capsule Take 15 mg by mouth daily.        Marland Kitchen olmesartan (BENICAR) 40 MG tablet Take 1 tablet (40 mg total) by mouth daily.  30 tablet  1  . Omega-3 Fatty Acids (FISH OIL) 1000 MG CAPS Take 1,000 mg by mouth daily.        Marland Kitchen DISCONTD: amLODipine-olmesartan (AZOR) 5-40 MG per tablet Take 1 tablet by mouth daily.          BP 128/80  Pulse 63  Temp(Src) 98.3 F (36.8 C) (Oral)  Resp 18  Wt 243 lb (110.224 kg)  SpO2 99%       Objective:   Physical Exam    Constitutional: Appears well-developed and well-nourished. No distress.  Cardiovascular: Normal rate, regular rhythm and normal heart sounds.  Exam reveals no gallop and no friction rub.   No murmur heard. Pulmonary/Chest: Effort normal and breath sounds normal.  No wheezes. No rales.  Psychiatric: Normal mood and affect. Behavior is normal.    Assessment & Plan:

## 2011-03-18 ENCOUNTER — Encounter: Payer: Self-pay | Admitting: *Deleted

## 2011-03-18 NOTE — Telephone Encounter (Signed)
Orders placed for [facility]

## 2011-04-07 NOTE — Op Note (Signed)
[name], Joshua [name]             ACCOUNT NO.:  [id number]   MEDICAL RECORD NO.:  [id number]          PATIENT TYPE:  INP   LOCATION:  5032                         FACILITY:  [facility]   PHYSICIAN:  Loreta Ave, M.D. DATE OF BIRTH:  [birth year]   DATE OF PROCEDURE:  09/19/2007  DATE OF DISCHARGE:                               OPERATIVE REPORT   PREOPERATIVE DIAGNOSIS:  End-stage degenerative arthritis, right hip.   POSTOPERATIVE DIAGNOSIS:  End-stage degenerative arthritis, right hip,  with marked collapse of the femoral head.   PROCEDURE:  Right total hip replacement, Stryker Accolade prosthesis.  Press-Fit 58-mm TSL acetabular cup screw fixation x2.  A ceramic liner  36-mm internal diameter.  Press-Fit humeral component with a 4.5 stem,  127-degree neck angle and a 36 +0 ceramic head.   SURGEON:  Mckinley Jewel, M.D.   ASSISTANT:  Zonia Kief, P.A., present throughout the entire case.   ANESTHESIA:  General.   BLOOD LOSS:  200 mL.  Blood given none.   SPECIMENS:  None.   CULTURES:  Normal.   COMPLICATIONS:  None.   PROCEDURE:  Soft compressive with abduction pillow.   PROCEDURE:  The patient was brought to the operating room and placed on  operating table in supine position.  After adequate anesthesia by  obtained, turned to a lateral position right side up.  Prepped and  draped in the usual sterile fashion.  Incision along the shaft of femur  to the trochanter and then extending posterior to superior.  Skin and  subcutaneous tissues divided.  Iliotibial band exposed, incised,  Charnley retractor put in place.  Neurovascular structures identified  and protected.  External rotator and capsule taken down off the back of  the intertrochanteric groove on the femur and tagged with a FiberWire.  The hip was dislocated posteriorly.  The femoral neck cut one  fingerbreadth above the lesser trochanter in line with definitive  prosthesis.  Acetabulum exposed.  Marked grade 4  changes throughout.  Very redundant soft tissue and labrum all taken down.  Acetabulum well  identified.  Appropriate retractors in place.  Reamed up to good  bleeding bone for 58-mm component.  The component was hammered in place  with 45 degrees of abduction and 25 of anteversion.  Excellent capturing  and fixation.  Augmented with two screws through the cup.  A 36-mm  internal diameter ceramic liner was inserted after we had done  appropriate trials.  Attention was turned to the femur.  Sequential  reaming up for good fitting proximally and distally.  I utilized a 4.5  component, restoring normal anteversion.  Good capturing fixation  alignment.  With the +0 x 36-mm head, I had good restoration of leg  lengths with excellent stability in flexion and extension.  After all  trials, definitive component was seen the femur.  The head attached.  The hip reduced.  I was very pleased with restoration of leg lengths and  stability.  Wound irrigated.  The external rotator and capsule repaired  at the back of the intertrochanteric groove through drill holes and the  FiberWire tied over  bony bridge.  Retractor removed.  The iliotibial  band closed with #1 Vicryl, skin and subcutaneous tissue with Vicryl and  staples.  Margins were injected with Marcaine.  Sterile compressive  dressing applied.  Returned to the supine position.  Anesthesia  reversed.  Brought to the recovery room.  Tolerated the surgery well  with no complications.      Loreta Ave, M.D.  Electronically Signed     DFM/MEDQ  D:  09/19/2007  T:  09/20/2007  Job:  [id number]

## 2011-04-07 NOTE — Assessment & Plan Note (Signed)
[facility]                                 ON-CALL NOTE   NAME:[name], [name]                      MRN:          [id number]  DATE:08/06/2007                            DOB:          [birth year]    Patient of Dr. Janese Banks called, but the patient's name is Joshua [name] Something  that did not come through on the pager.  His phone number is [phone].  The patient is due for a hip replacement by Dr. Eulah Pont and was given  pain medicines, Darvocet and ibuprofen.  However, it is not helping his  pain this weekend.  He is becoming more severe.  He has no associated  fever or new injury.  Have recommended he call Dr. Greig Right answering  service and have them manage what should be done about his increasing  pain and medications.     Neta Mends. Panosh, MD  Electronically Signed    WKP/MedQ  DD: 08/06/2007  DT: 08/07/2007  Job #: [id number]

## 2011-04-08 NOTE — Assessment & Plan Note (Signed)
Good response to cialis

## 2011-04-08 NOTE — Assessment & Plan Note (Signed)
BP: 128/80 mmHg   Well controlled Change azor to benicar and amlodipine due cost reasons Lab Results  Component Value Date   CREATININE 1.27 01/23/2011

## 2011-04-10 NOTE — Op Note (Signed)
NAME:  Joshua Little, Joshua Little             ACCOUNT NO.:  [id number]   MEDICAL RECORD NO.:  [id number]          PATIENT TYPE:  AMB   LOCATION:  DSC                          FACILITY:  [facility]   PHYSICIAN:  Harvie Junior, M.D.   DATE OF BIRTH:  [birth year]   DATE OF PROCEDURE:  DATE OF DISCHARGE:                                 OPERATIVE REPORT   PREOPERATIVE DIAGNOSIS:  Medial meniscal tear with suspected chondral  injury.   POSTPROCEDURE DIAGNOSES:  1. Posterior horn medial meniscal tear.  2. Medial condylar flap with no bone on the back side, large probably 12 x      15 mm.  3. Chondromalacia of the patella, grade 2 and grade 3.   PROCEDURE:  1. Partial posterior medial meniscectomy.  2. Chondroplasty medial femoral condyle with drilling of medial femoral      condyle with vascular enhancement awls.  3. Debridement of the chondromalacia and patella.   SURGEON:  Harvie Junior, M.D.   ASSISTANT:  Marshia Ly, P.A.   ANESTHESIA:  General.   BRIEF HISTORY:  The patient is a 55 year old male with long history of  having had pain and swelling in the knee.  He has been aspirated and  injected on a couple of occasions.  He continued to have complaints of  catching, locking, grabbing pain.  We evaluated him and felt that he had a  meniscal tear with possible chondral injury.  We talked about treatment  options but ultimately felt that arthroscopic intervention to be the most  appropriate course of action.  Briefly discussed MRI.  We felt that it  probably would not add much and given his long duration of symptoms, that  arthroscopy was appropriate.  He is brought to the operating room and wishes  to proceed with procedure.   PROCEDURE:  The patient brought to the operating room and after adequate  anesthesia was obtained with general anesthetic and placed on the operating  room table.  The right leg was prepped and draped in the usual sterile  fashion.  Following this, routine  arthroscopic examination of the knee  revealed there was obvious posterior horn medial meniscal tear.  This was  debrided back to a smooth and stable rim.   We encountered a large medial condylar flap on introduction into the medial  compartment.  This was probably 12 x 15 mm.  There was no bone on the back  side of this flap.  It was completely lose.  The probe could actually bring  it down into the joint.  Following debridement of the posterior horn, the  meniscus area was debrided and there was no exposed bone but given the  nature of the debridement and the high walls for the lesions, that vascular  enhancement drilling was appropriate and microfracture technique was  performed on the medial femoral condyle.   ACL was evaluated and noted to be normal.  Lateral side normal.  Patellofemoral joint shows some grade 2 and 3 changes and this was debrided  back to a smooth and stable rim.  Final check was made for  any lose and  fragmented  pieces and seeing none, the knee was copiously irrigated and suctioned dry.  Arthroscopic portals was closed with a bandage and sterile compression  dressing was applied.  The patient was taken to recovery.  He was noted to  be in satisfactory condition.  Estimated blood loss for procedure was none.      Harvie Junior, M.D.  Electronically Signed     JLG/MEDQ  D:  09/29/2006  T:  09/30/2006  Job:  [id number]

## 2011-04-10 NOTE — Assessment & Plan Note (Signed)
[facility]                          [place] OFFICE NOTE   NAME:Mikelson, KAWON                      MRN:          [id number]  DATE:07/12/2006                            DOB:          07-Jun-1956    NEW PATIENT HISTORY AND PHYSICAL:   CHIEF COMPLAINT:  Leg pain.   HISTORY OF PRESENT ILLNESS:  Mr. Ryser is a 55 year old gentleman who  came to the office for the first time to get established.  For several  months, he has noticed right leg pain, usually at the right knee on the  lateral side of his thigh.  The pain is worse when he walks, and he feels  sometimes that his leg gives out.  He denies any back pain, calf pain, knee  swelling.  He has not taken anything over-the-counter to help the pain  except Aleve.  Also, for years, he has noticed acid reflux described as a  burning feeling in the chest and throat, mostly at night.  Denies  odynophagia, dysphasia.  Has tried Zantac without any help.  On today's  exam, we noticed the blood pressure to be high.  He was unaware of that.  In  fact, he had never been told he has hypertension.  He has not checked his  blood pressure in a long time.   PAST MEDICAL HISTORY:  Basically negative.   FAMILY HISTORY:  1. Father has gout.  2. Mother has a history of clots, and she is on Coumadin.  3. Sister has diabetes.  4. No history of colon or prostate cancer.  No history of heart attacks.   SOCIAL HISTORY:  The patient is single.  Has 5 kids.  Quit tobacco in 2000.  Drinks only socially.   REVIEW OF SYSTEMS:  Per the history of present illness.  In addition, he  denies any headaches, nose bleeds, leg swelling or shortness of breath.   MEDICATIONS:  None.   ALLERGIES:  No known drug allergies.   PHYSICAL EXAMINATION:  GENERAL:  The patient is alert, oriented and not in  distress.  VITAL SIGNS:  Blood pressure was 210/110 in the right arm; I repeated the  blood pressure myself and I got  190/100.  The left arm showed a blood  pressure of 200/120.  He weighs 248 pounds.  He is _________ inches tall.  His pulse is 64.  LUNGS:  Clear to auscultation.  CARDIOVASCULAR:  Regular rate and rhythm without a murmur.  ABDOMEN:  Not distended, soft.  Good bowel sounds.  No organomegaly.  EXTREMITIES:  No edema, and good femoral pulses bilaterally.  Knee exam is  basically normal without any swelling, tenderness, or decreased range of  motion.   ASSESSMENT AND PLAN:  1. The patient has right knee pain.  He probably has some degree of      osteoarthritis.  At this point, I recommend him Tylenol as needed, and      I gave him a pamphlet of information about physical therapy for the      knee that he can practice at home.  If the  symptoms persist, further      work-up needs to be done.  2. He has classic gastroesophageal reflux disease symptoms.  I gave him      information about gastroesophageal reflux disease, how it is and how to      prevent it.  I also gave him samples of Protonix and a prescription for      it.  If he is unable to afford it, he can take Prilosec.  3. Elevated blood pressure.  He does not carry the diagnosis of      hypertension.  I told the patient I am glad he came to the office      because this is something that he ought to take care of.  I discussed      with him the risks of high blood pressure including a stroke or heart      attack, and I gave him 3 pages of information about what blood pressure      is and again what are the risks.  I also recommended him a low salt      diet, and advised him to come back in 10-14 days to recheck his blood      pressure and see about further work-up and treatment.  The patient      states that he will come back.                                   Willow Ora, MD   JP/MedQ  DD:  07/12/2006  DT:  07/12/2006  Job #:  [id number]

## 2011-04-15 ENCOUNTER — Telehealth: Payer: Self-pay | Admitting: Internal Medicine

## 2011-04-15 NOTE — Telephone Encounter (Signed)
Call placed to patient at [phone], he states that he needs refill on his blood pressure medication. He states he does not need a refill on the Hydrocodone.  Call placed to pharmacy, The pharmacist has verified rx for Amlodipine and Benicar. Call was returned to patient he was informed Rx refills requested are at the pharmacy. He was advised to check with his pharmacy for medication refills. Patient has verbalized understanding and agrees.

## 2011-04-15 NOTE — Telephone Encounter (Signed)
Pt states that pharmacy did not receive refills on benicar and hydrocodone

## 2011-05-12 NOTE — Telephone Encounter (Signed)
This encounter was created in error - please disregard.

## 2011-06-02 ENCOUNTER — Telehealth: Payer: Self-pay | Admitting: Internal Medicine

## 2011-06-02 DIAGNOSIS — R7309 Other abnormal glucose: Secondary | ICD-10-CM

## 2011-06-02 DIAGNOSIS — I10 Essential (primary) hypertension: Secondary | ICD-10-CM

## 2011-06-02 NOTE — Telephone Encounter (Signed)
Pt made a 3 month follow up for august. He wants to make sure orders were put in for labs.

## 2011-06-05 NOTE — Telephone Encounter (Signed)
Lab order has been entered and forwarded to the lab. Pt is aware.

## 2011-06-19 ENCOUNTER — Ambulatory Visit: Payer: BC Managed Care – PPO | Admitting: Internal Medicine

## 2011-07-09 ENCOUNTER — Ambulatory Visit (INDEPENDENT_AMBULATORY_CARE_PROVIDER_SITE_OTHER): Payer: BC Managed Care – PPO | Admitting: Internal Medicine

## 2011-07-09 ENCOUNTER — Encounter: Payer: Self-pay | Admitting: Internal Medicine

## 2011-07-09 DIAGNOSIS — R202 Paresthesia of skin: Secondary | ICD-10-CM

## 2011-07-09 DIAGNOSIS — R7302 Impaired glucose tolerance (oral): Secondary | ICD-10-CM

## 2011-07-09 DIAGNOSIS — R7309 Other abnormal glucose: Secondary | ICD-10-CM

## 2011-07-09 DIAGNOSIS — G5600 Carpal tunnel syndrome, unspecified upper limb: Secondary | ICD-10-CM

## 2011-07-09 DIAGNOSIS — R209 Unspecified disturbances of skin sensation: Secondary | ICD-10-CM

## 2011-07-09 MED ORDER — DICLOFENAC SODIUM 75 MG PO TBEC: DELAYED_RELEASE_TABLET | ORAL | Status: AC

## 2011-07-10 LAB — BASIC METABOLIC PANEL
Potassium: 4.2 mEq/L (ref 3.5–5.3)
Sodium: 139 mEq/L (ref 135–145)

## 2011-07-10 LAB — HEMOGLOBIN A1C
Hgb A1c MFr Bld: 6.1 % — ABNORMAL HIGH (ref <5.7)
Mean Plasma Glucose: 128 mg/dL — ABNORMAL HIGH (ref <117)

## 2011-07-10 LAB — CBC
HCT: 43.5 % (ref 39.0–52.0)
Platelets: 203 10*3/uL (ref 150–400)
RBC: 4.8 MIL/uL (ref 4.22–5.81)
RDW: 13.9 % (ref 11.5–15.5)
WBC: 5.3 10*3/uL (ref 4.0–10.5)

## 2011-07-12 NOTE — Assessment & Plan Note (Signed)
Obtain b12 and tsh. Begin wrist splint

## 2011-07-12 NOTE — Assessment & Plan Note (Signed)
Obtain cbc, chem7 and a1c 

## 2011-07-12 NOTE — Progress Notes (Signed)
  Subjective:    Patient ID: Joshua Little, male    DOB: October 05, 1956, 55 y.o.   MRN: [id number]  HPI Pt presents to clinic for followup of multiple medical problems. C/o left hand and arm tingling for one month. Denies neck pain or arm/hand weakness. No exacerbating or alleviating factors. H/o CTS and used wrist splints remotely.BP minimally elevated and home bp's less than 135/75. Has not undergone colonoscopy. No change in bowel habits, blood in stool or fam hx of colon cancer. Willing to consider scheduling colonoscopy and will call when ready.  No other complaints.   Past Medical History  Diagnosis Date  . Hypercholesterolemia   . GERD (gastroesophageal reflux disease)   . Hypertension   . ED (erectile dysfunction)   . Carpal tunnel syndrome    Past Surgical History  Procedure Date  . 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 Umbilical hernia repair     as a baby    reports that he has quit smoking. He has never used smokeless tobacco. His alcohol and drug histories not on file. family history includes Diabetes in his sister; Gout in his father; and Other in an unspecified family member. No Known Allergies   Review of Systems see hpi    Objective:   Physical Exam  Physical Exam  Nursing note and vitals reviewed. Constitutional: Appears well-developed and well-nourished. No distress.  HENT:  Head: Normocephalic and atraumatic.  Right Ear: External ear normal.  Left Ear: External ear normal.  Eyes: Conjunctivae are normal. No scleral icterus.  Neck: Neck supple. Carotid bruit is not present.  Cardiovascular: Normal rate, regular rhythm and normal heart sounds.  Exam reveals no gallop and no friction rub.   No murmur heard. Pulmonary/Chest: Effort normal and breath sounds normal. No respiratory distress. He has no wheezes. no rales.  Lymphadenopathy:    He has no cervical adenopathy.  Neurological:Alert.  Skin: Skin is warm and dry. Not diaphoretic.    Psychiatric: Has a normal mood and affect.   MSK: +left phalen's. No thenar muscle wasting.     Assessment & Plan:

## 2011-09-02 LAB — COMPREHENSIVE METABOLIC PANEL
Alkaline Phosphatase: 84
BUN: 14
CO2: 27
Calcium: 9.5
GFR calc non Af Amer: >60
Glucose, Bld: 107 — ABNORMAL HIGH
Total Protein: 8

## 2011-09-02 LAB — URINALYSIS, ROUTINE W REFLEX MICROSCOPIC
Glucose, UA: NEGATIVE
Hgb urine dipstick: NEGATIVE
Ketones, ur: NEGATIVE
Protein, ur: NEGATIVE
Urobilinogen, UA: 0.2

## 2011-09-02 LAB — CBC
HCT: 34.1 — ABNORMAL LOW
HCT: 43.7
Hemoglobin: 11.5 — ABNORMAL LOW
Hemoglobin: 14.7
MCHC: 33.4
MCHC: 33.6
MCHC: 34.2
MCV: 88.1
MCV: 89
Platelets: 232
Platelets: 236
RBC: 4.91
RDW: 13.6
RDW: 13.6
RDW: 14
WBC: 8.3

## 2011-09-02 LAB — BASIC METABOLIC PANEL
BUN: 11
BUN: 16
CO2: 28
Calcium: 8.7
Chloride: 102
Creatinine, Ser: 1.13
Creatinine, Ser: 1.19
GFR calc non Af Amer: >60
Glucose, Bld: 131 — ABNORMAL HIGH
Glucose, Bld: 136 — ABNORMAL HIGH
Potassium: 3.9
Sodium: 135

## 2011-09-02 LAB — TYPE AND SCREEN
ABO/RH(D): A NEG
Antibody Screen: NEGATIVE

## 2011-09-02 LAB — PROTIME-INR
INR: 0.9
INR: 1
Prothrombin Time: 12.4
Prothrombin Time: 12.9
Prothrombin Time: 13.7

## 2011-09-02 LAB — ABO/RH: ABO/RH(D): A NEG

## 2011-11-10 ENCOUNTER — Ambulatory Visit: Payer: BC Managed Care – PPO | Admitting: Internal Medicine

## 2011-11-11 ENCOUNTER — Encounter: Payer: Self-pay | Admitting: Internal Medicine

## 2011-11-11 ENCOUNTER — Ambulatory Visit (INDEPENDENT_AMBULATORY_CARE_PROVIDER_SITE_OTHER): Payer: BC Managed Care – PPO | Admitting: Internal Medicine

## 2011-11-11 ENCOUNTER — Ambulatory Visit (HOSPITAL_BASED_OUTPATIENT_CLINIC_OR_DEPARTMENT_OTHER)
Admission: RE | Admit: 2011-11-11 | Discharge: 2011-11-11 | Disposition: A | Discharge status: Home / Self Care | Payer: BC Managed Care – PPO | Source: Ambulatory Visit | Attending: Internal Medicine | Admitting: Internal Medicine

## 2011-11-11 DIAGNOSIS — R739 Hyperglycemia, unspecified: Secondary | ICD-10-CM

## 2011-11-11 DIAGNOSIS — Z23 Encounter for immunization: Secondary | ICD-10-CM

## 2011-11-11 DIAGNOSIS — M5412 Radiculopathy, cervical region: Secondary | ICD-10-CM

## 2011-11-11 DIAGNOSIS — M47812 Spondylosis without myelopathy or radiculopathy, cervical region: Secondary | ICD-10-CM

## 2011-11-11 DIAGNOSIS — IMO0002 Reserved for concepts with insufficient information to code with codable children: Secondary | ICD-10-CM

## 2011-11-11 DIAGNOSIS — R209 Unspecified disturbances of skin sensation: Secondary | ICD-10-CM

## 2011-11-11 DIAGNOSIS — R7309 Other abnormal glucose: Secondary | ICD-10-CM

## 2011-11-11 LAB — BASIC METABOLIC PANEL
CO2: 23 mEq/L (ref 19–32)
Calcium: 9.7 mg/dL (ref 8.4–10.5)
Creat: 1.25 mg/dL (ref 0.50–1.35)
Sodium: 140 mEq/L (ref 135–145)

## 2011-11-11 LAB — HEMOGLOBIN A1C: Hgb A1c MFr Bld: 6.1 % — ABNORMAL HIGH (ref <5.7)

## 2011-11-11 MED ORDER — METHYLPREDNISOLONE 4 MG PO KIT: PACK | ORAL | Status: DC

## 2011-11-15 DIAGNOSIS — R739 Hyperglycemia, unspecified: Secondary | ICD-10-CM | POA: Insufficient documentation

## 2011-11-15 DIAGNOSIS — M5412 Radiculopathy, cervical region: Secondary | ICD-10-CM | POA: Insufficient documentation

## 2011-11-15 NOTE — Assessment & Plan Note (Signed)
Obtain chem7, a1c 

## 2011-11-15 NOTE — Assessment & Plan Note (Signed)
Obtain plain xray cspine. Attempt medrol dosepak. Followup if no improvement or worsening.

## 2011-11-15 NOTE — Progress Notes (Signed)
  Subjective:    Patient ID: Joshua Little, male    DOB: Jul 12, 1956, 55 y.o.   MRN: [id number]  HPI Pt presents to clinic for followup of multiple medical problems. bp minimally high and normotensive on home monitoring. Previously felt to have CTS of left hand/arm but now sx's of numbness involve left arm entirely. No neck pain or arm weakness. Paresthesia is positional. No other alleviating or exacerbating factors. Reviewed h/o hyperglycemia not requiring medication. Reviewed recommendation for screening colonoscopy and continues to defer.   Past Medical History  Diagnosis Date  . Hypercholesterolemia   . GERD (gastroesophageal reflux disease)   . Hypertension   . ED (erectile dysfunction)   . Carpal tunnel syndrome    Past Surgical History  Procedure Date  . 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 Umbilical hernia repair     as a baby    reports that he has quit smoking. He has never used smokeless tobacco. His alcohol and drug histories not on file. family history includes Diabetes in his sister; Gout in his father; and Other in an unspecified family member. No Known Allergies    Review of Systems see hpi     Objective:   Physical Exam  Nursing note and vitals reviewed. Constitutional: He appears well-developed and well-nourished. No distress.  HENT:  Head: Normocephalic and atraumatic.  Right Ear: External ear normal.  Left Ear: External ear normal.  Eyes: Conjunctivae are normal. No scleral icterus.  Neck: Neck supple.  Musculoskeletal:       FROM neck. NT midline without bony abn. Left arm FROM and strength 5/5 distal muscles.  Neurological: He is alert.  Skin: Skin is warm and dry. He is not diaphoretic.  Psychiatric: He has a normal mood and affect.          Assessment & Plan:

## 2011-12-01 ENCOUNTER — Other Ambulatory Visit: Payer: Self-pay | Admitting: *Deleted

## 2011-12-01 MED ORDER — AMLODIPINE BESYLATE 5 MG PO TABS: 5.0000 mg | ORAL_TABLET | Freq: Every day | ORAL | Status: DC

## 2011-12-01 NOTE — Telephone Encounter (Signed)
Patient called and left voice message requesting refill on medication. He stated the pharmacy has informed him he was out, and to contact his doctors office. Medication refill needed was not stated in his message.

## 2011-12-01 NOTE — Telephone Encounter (Signed)
Call placed to patient at [phone], he was asked which medication refill was needed. He stated he needed a refill on Amlodipine to [facility]. He was informed Rx would be sent to pharmacy.

## 2012-02-26 ENCOUNTER — Ambulatory Visit (INDEPENDENT_AMBULATORY_CARE_PROVIDER_SITE_OTHER): Payer: BC Managed Care – PPO | Admitting: Internal Medicine

## 2012-02-26 ENCOUNTER — Encounter: Payer: Self-pay | Admitting: Internal Medicine

## 2012-02-26 ENCOUNTER — Telehealth: Payer: Self-pay | Admitting: *Deleted

## 2012-02-26 VITALS — BP 154/90 | HR 83 | Temp 98.3°F | Resp 18 | Wt 247.0 lb

## 2012-02-26 DIAGNOSIS — Z23 Encounter for immunization: Secondary | ICD-10-CM

## 2012-02-26 DIAGNOSIS — R7309 Other abnormal glucose: Secondary | ICD-10-CM

## 2012-02-26 DIAGNOSIS — R300 Dysuria: Secondary | ICD-10-CM | POA: Insufficient documentation

## 2012-02-26 DIAGNOSIS — R739 Hyperglycemia, unspecified: Secondary | ICD-10-CM

## 2012-02-26 DIAGNOSIS — M5412 Radiculopathy, cervical region: Secondary | ICD-10-CM

## 2012-02-26 DIAGNOSIS — I10 Essential (primary) hypertension: Secondary | ICD-10-CM

## 2012-02-26 MED ORDER — LEVOFLOXACIN 500 MG PO TABS: 500.0000 mg | ORAL_TABLET | Freq: Every day | ORAL | Status: DC

## 2012-02-26 MED ORDER — AMLODIPINE BESYLATE 10 MG PO TABS: 10.0000 mg | ORAL_TABLET | Freq: Every day | ORAL | Status: DC

## 2012-02-26 NOTE — Assessment & Plan Note (Signed)
Some improved. Discussed PT and currently declines. Followup if no improvement or worsening.

## 2012-02-26 NOTE — Patient Instructions (Signed)
Please schedule fasting labs prior to next visit Chem7, a1c- hyperglycemia, lipid/lft-272.4

## 2012-02-26 NOTE — Assessment & Plan Note (Signed)
Obtain ua and urine cx. Attempt course of levaquin. Followup if no improvement or worsening.

## 2012-02-26 NOTE — Assessment & Plan Note (Signed)
Mildly suboptimal. Increase norvasc 10mg  qd and monitor bp. Plans on resuming cardio exercise as well

## 2012-02-26 NOTE — Assessment & Plan Note (Signed)
Resume exercise. Obtain chem7 and a1c prior to next visit

## 2012-02-26 NOTE — Progress Notes (Signed)
  Subjective:    Patient ID: Joshua Little, male    DOB: [birth year], 56 y.o.   MRN: [id number]  HPI Pt presents to clinic for followup of multiple medical problems. bp elevated but states home bp's 130's-140's. C/o 2 wk h/o urinary pressure at the penis when urinating. Denies burning, urethral discharge or hematuria. Notes continued left arm paresthesias suspected to of cervical etiology. Notes improvement after medrol dosepak. Occurs primary at night and is intermittent. Neck xray reviewed showing spondylosis and mild bilateral foraminal narrowing.   Past Medical History  Diagnosis Date  . Hypercholesterolemia   . GERD (gastroesophageal reflux disease)   . Hypertension   . ED (erectile dysfunction)   . Carpal tunnel syndrome    Past Surgical History  Procedure Date  . 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 Umbilical hernia repair     as a baby    reports that he has quit smoking. He has never used smokeless tobacco. His alcohol and drug histories not on file. family history includes Diabetes in his sister; Gout in his father; and Other in an unspecified family member. No Known Allergies    Review of Systems see hpi     Objective:   Physical Exam  Physical Exam  Nursing note and vitals reviewed. Constitutional: Appears well-developed and well-nourished. No distress.  HENT:  Head: Normocephalic and atraumatic.  Right Ear: External ear normal.  Left Ear: External ear normal.  Eyes: Conjunctivae are normal. No scleral icterus.  Neck: Neck supple. Carotid bruit is not present.  Cardiovascular: Normal rate, regular rhythm and normal heart sounds.  Exam reveals no gallop and no friction rub.   No murmur heard. Pulmonary/Chest: Effort normal and breath sounds normal. No respiratory distress. He has no wheezes. no rales.  Lymphadenopathy:    He has no cervical adenopathy.  Neurological:Alert.  Skin: Skin is warm and dry. Not diaphoretic.  Psychiatric: Has a  normal mood and affect.        Assessment & Plan:

## 2012-02-27 LAB — URINALYSIS, ROUTINE W REFLEX MICROSCOPIC
Bilirubin Urine: NEGATIVE
Glucose, UA: NEGATIVE mg/dL
Hgb urine dipstick: NEGATIVE
Leukocytes, UA: NEGATIVE
Protein, ur: NEGATIVE mg/dL
Urobilinogen, UA: 1 mg/dL (ref 0.0–1.0)

## 2012-04-11 ENCOUNTER — Encounter: Payer: Self-pay | Admitting: Internal Medicine

## 2012-04-11 ENCOUNTER — Ambulatory Visit (INDEPENDENT_AMBULATORY_CARE_PROVIDER_SITE_OTHER): Payer: BC Managed Care – PPO | Admitting: Internal Medicine

## 2012-04-11 VITALS — BP 128/80 | HR 73 | Temp 98.5°F | Resp 20 | Wt 252.0 lb

## 2012-04-11 DIAGNOSIS — M7989 Other specified soft tissue disorders: Secondary | ICD-10-CM

## 2012-04-11 DIAGNOSIS — J329 Chronic sinusitis, unspecified: Secondary | ICD-10-CM | POA: Insufficient documentation

## 2012-04-11 MED ORDER — AMOXICILLIN-POT CLAVULANATE 875-125 MG PO TABS: 1.0000 | ORAL_TABLET | Freq: Two times a day (BID) | ORAL | Status: DC

## 2012-04-11 NOTE — Assessment & Plan Note (Signed)
Suspect contribution from norvasc. No clinical evidence for volume overload. Sx's mild. Recommend observation.

## 2012-04-11 NOTE — Assessment & Plan Note (Signed)
Begin abx course. Followup if no improvement or worsening. 

## 2012-04-11 NOTE — Progress Notes (Signed)
  Subjective:    Patient ID: Joshua Little, male    DOB: 1956/06/16, 56 y.o.   MRN: [id number]  HPI Pt presents to clinic for evaluation of leg swelling and cough. Notes one day h/o bilateral le and ankle swelling. Describes swelling as mild. No associated dsypnea. Last recent visit increased norvasc to 10mg  qd. Also notes 3wk h/o ha, cough productive for gray sputum. Taking otc medication without improvement. No f/c.   Past Medical History  Diagnosis Date  . Hypercholesterolemia   . GERD (gastroesophageal reflux disease)   . Hypertension   . ED (erectile dysfunction)   . Carpal tunnel syndrome    Past Surgical History  Procedure Date  . 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 Umbilical hernia repair     as a baby    reports that he has quit smoking. He has never used smokeless tobacco. His alcohol and drug histories not on file. family history includes Diabetes in his sister; Gout in his father; and Other in an unspecified family member. No Known Allergies   Review of Systems see hpi     Objective:   Physical Exam  Nursing note and vitals reviewed. Constitutional: He appears well-developed and well-nourished. No distress.  HENT:  Head: Normocephalic and atraumatic.  Right Ear: External ear normal.  Left Ear: External ear normal.  Eyes: Conjunctivae are normal. No scleral icterus.  Neck: No JVD present.  Cardiovascular: Normal rate, regular rhythm and normal heart sounds.  Exam reveals no gallop and no friction rub.   No murmur heard. Pulmonary/Chest: Effort normal and breath sounds normal. No respiratory distress. He has no wheezes. He has no rales.  Neurological: He is alert.  Skin: Skin is warm and dry. He is not diaphoretic.       Bilateral le: +1 edema. No palpable cords. No erythema/warmth/redness  Psychiatric: He has a normal mood and affect.          Assessment & Plan:

## 2012-06-23 ENCOUNTER — Telehealth: Payer: Self-pay | Admitting: *Deleted

## 2012-06-23 DIAGNOSIS — E785 Hyperlipidemia, unspecified: Secondary | ICD-10-CM

## 2012-06-23 DIAGNOSIS — R7309 Other abnormal glucose: Secondary | ICD-10-CM

## 2012-06-23 LAB — HEPATIC FUNCTION PANEL
ALT: 27 U/L (ref 0–53)
Albumin: 4.4 g/dL (ref 3.5–5.2)
Alkaline Phosphatase: 84 U/L (ref 39–117)
Indirect Bilirubin: 0.8 mg/dL (ref 0.0–0.9)
Total Protein: 7.1 g/dL (ref 6.0–8.3)

## 2012-06-23 LAB — BASIC METABOLIC PANEL
BUN: 19 mg/dL (ref 6–23)
Chloride: 107 mEq/L (ref 96–112)
Creat: 1.13 mg/dL (ref 0.50–1.35)
Glucose, Bld: 91 mg/dL (ref 70–99)
Potassium: 4 mEq/L (ref 3.5–5.3)

## 2012-06-23 LAB — LIPID PANEL: LDL Cholesterol: 133 mg/dL — ABNORMAL HIGH (ref 0–99)

## 2012-06-23 NOTE — Telephone Encounter (Signed)
Pt presented to the lab and orders entered per previous office note:  Please schedule fasting labs prior to next visit  Chem7, a1c- hyperglycemia, lipid/lft-272.4

## 2012-07-01 ENCOUNTER — Ambulatory Visit (INDEPENDENT_AMBULATORY_CARE_PROVIDER_SITE_OTHER): Payer: BC Managed Care – PPO | Admitting: Internal Medicine

## 2012-07-01 ENCOUNTER — Encounter: Payer: Self-pay | Admitting: Internal Medicine

## 2012-07-01 VITALS — BP 140/86 | HR 64 | Temp 98.2°F | Resp 20 | Wt 244.1 lb

## 2012-07-01 DIAGNOSIS — F528 Other sexual dysfunction not due to a substance or known physiological condition: Secondary | ICD-10-CM

## 2012-07-01 DIAGNOSIS — I10 Essential (primary) hypertension: Secondary | ICD-10-CM

## 2012-07-01 DIAGNOSIS — G5600 Carpal tunnel syndrome, unspecified upper limb: Secondary | ICD-10-CM

## 2012-07-01 DIAGNOSIS — R209 Unspecified disturbances of skin sensation: Secondary | ICD-10-CM

## 2012-07-01 DIAGNOSIS — R200 Anesthesia of skin: Secondary | ICD-10-CM

## 2012-07-01 NOTE — Patient Instructions (Signed)
Please schedule morning labs for next Tuesday B12, tsh-paresthesia and testosterone-ED Please schedule fasting labs prior to next visit Chem7, a1c-hypergylcemia and lipid-272.4

## 2012-07-02 DIAGNOSIS — R200 Anesthesia of skin: Secondary | ICD-10-CM | POA: Insufficient documentation

## 2012-07-02 NOTE — Assessment & Plan Note (Signed)
Treat cts. Obtain b12 and tsh. Followup if no improvement or worsening.

## 2012-07-02 NOTE — Assessment & Plan Note (Signed)
Obtain testosterone

## 2012-07-02 NOTE — Progress Notes (Signed)
  Subjective:    Patient ID: Joshua Little, male    DOB: 01/16/1956, 56 y.o.   MRN: [id number]  HPI Pt presents to clinic for followup of multiple medical problems. Notes intermittent tingling of bilateral hands and feet. Occurs at night and sometimes occurs with hands while holding the steering wheel. Home bp's borderline. Denies neck pain or radicular arm pain. Compliant with medication with no adverse effects. Wt down 8lbs since last visit. C/o continued ED.  Past Medical History  Diagnosis Date  . Hypercholesterolemia   . GERD (gastroesophageal reflux disease)   . Hypertension   . ED (erectile dysfunction)   . Carpal tunnel syndrome    Past Surgical History  Procedure Date  . 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 Umbilical hernia repair     as a baby    reports that he has quit smoking. He has never used smokeless tobacco. His alcohol and drug histories not on file. family history includes Diabetes in his sister; Gout in his father; and Other in an unspecified family member. No Known Allergies    Review of Systems see hpi     Objective:   Physical Exam  Physical Exam  Nursing note and vitals reviewed. Constitutional: Appears well-developed and well-nourished. No distress.  HENT:  Head: Normocephalic and atraumatic.  Right Ear: External ear normal.  Left Ear: External ear normal.  Eyes: Conjunctivae are normal. No scleral icterus.  Neck: Neck supple. Carotid bruit is not present.  Cardiovascular: Normal rate, regular rhythm and normal heart sounds.  Exam reveals no gallop and no friction rub.   No murmur heard. Pulmonary/Chest: Effort normal and breath sounds normal. No respiratory distress. He has no wheezes. no rales.  Lymphadenopathy:    He has no cervical adenopathy.  Neurological:Alert.  Skin: Skin is warm and dry. Not diaphoretic.  Psychiatric: Has a normal mood and affect.  MSK: +phalen on the left. No thenar muscle wasting.        Assessment & Plan:

## 2012-07-02 NOTE — Assessment & Plan Note (Signed)
Resume wrist splints at night. (has at home). Followup if no improvement or worsening.

## 2012-07-02 NOTE — Assessment & Plan Note (Signed)
Borderline control. Continue current regimen. Low salt diet, exercise and wt loss. If mild elevations persist then consider additional agent.

## 2012-09-20 ENCOUNTER — Other Ambulatory Visit: Payer: Self-pay | Admitting: Internal Medicine

## 2012-10-28 ENCOUNTER — Other Ambulatory Visit: Payer: Self-pay | Admitting: *Deleted

## 2012-10-28 DIAGNOSIS — E785 Hyperlipidemia, unspecified: Secondary | ICD-10-CM

## 2012-10-28 DIAGNOSIS — Z79899 Other long term (current) drug therapy: Secondary | ICD-10-CM

## 2012-10-28 DIAGNOSIS — R739 Hyperglycemia, unspecified: Secondary | ICD-10-CM

## 2012-10-28 LAB — LIPID PANEL
Cholesterol: 199 mg/dL (ref 0–200)
HDL: 54 mg/dL (ref >39)
Total CHOL/HDL Ratio: 3.7 Ratio
Triglycerides: 120 mg/dL (ref <150)
VLDL: 24 mg/dL (ref 0–40)

## 2012-10-28 LAB — BASIC METABOLIC PANEL
CO2: 23 mEq/L (ref 19–32)
Chloride: 108 mEq/L (ref 96–112)
Potassium: 4.4 mEq/L (ref 3.5–5.3)
Sodium: 141 mEq/L (ref 135–145)

## 2012-10-28 NOTE — Progress Notes (Signed)
Pt presented to lab; orders entered & released per 08.09.13 OV/SLS

## 2012-11-01 ENCOUNTER — Ambulatory Visit (INDEPENDENT_AMBULATORY_CARE_PROVIDER_SITE_OTHER): Payer: BC Managed Care – PPO | Admitting: Internal Medicine

## 2012-11-01 VITALS — BP 140/70 | HR 75 | Wt 248.0 lb

## 2012-11-01 DIAGNOSIS — I10 Essential (primary) hypertension: Secondary | ICD-10-CM

## 2012-11-01 DIAGNOSIS — Z23 Encounter for immunization: Secondary | ICD-10-CM

## 2012-11-01 DIAGNOSIS — G5600 Carpal tunnel syndrome, unspecified upper limb: Secondary | ICD-10-CM

## 2012-11-01 DIAGNOSIS — F528 Other sexual dysfunction not due to a substance or known physiological condition: Secondary | ICD-10-CM

## 2012-11-01 DIAGNOSIS — Z1211 Encounter for screening for malignant neoplasm of colon: Secondary | ICD-10-CM

## 2012-11-01 MED ORDER — LOSARTAN POTASSIUM 50 MG PO TABS: 50.0000 mg | ORAL_TABLET | Freq: Every day | ORAL | Status: DC

## 2012-11-01 MED ORDER — VARDENAFIL HCL 10 MG PO TABS
10.0000 mg | ORAL_TABLET | Freq: Every day | ORAL | Status: DC | PRN
Start: 2012-11-01 — End: 2013-04-13

## 2012-11-02 ENCOUNTER — Encounter: Payer: Self-pay | Admitting: Internal Medicine

## 2012-11-04 ENCOUNTER — Ambulatory Visit: Payer: BC Managed Care – PPO | Admitting: Internal Medicine

## 2012-11-05 ENCOUNTER — Encounter: Payer: Self-pay | Admitting: Internal Medicine

## 2012-11-05 NOTE — Assessment & Plan Note (Signed)
suboptimal control. Asx. Add losartan 50mg  qd. Monitor bp as outpt and f/u in clinic as scheduled.

## 2012-11-05 NOTE — Assessment & Plan Note (Signed)
Attempt levitra 20mg  prn samples and prescription provided. Aware of dosing instructions.

## 2012-11-05 NOTE — Assessment & Plan Note (Signed)
Improving. Continue wrist splint use

## 2012-11-05 NOTE — Progress Notes (Signed)
  Subjective:    Patient ID: Joshua Little, male    DOB: 25-Oct-1956, 56 y.o.   MRN: [id number]  HPI Pt presents to clinic for followup of multiple medical problems. States CTS sx's some improved wearing wrist splints at night. Can't wear them during the day at work. BP remaining mildly persistently elevated. C/o ED with intact libido. Ready to proceed with screening colonoscopy.  Past Medical History  Diagnosis Date  . Hypercholesterolemia   . GERD (gastroesophageal reflux disease)   . Hypertension   . ED (erectile dysfunction)   . Carpal tunnel syndrome    Past Surgical History  Procedure Date  . 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 Umbilical hernia repair     as a baby    reports that he has quit smoking. He has never used smokeless tobacco. His alcohol and drug histories not on file. family history includes Diabetes in his sister; Gout in his father; and Other in an unspecified family member. No Known Allergies    Review of Systems see hpi     Objective:   Physical Exam  Physical Exam  Nursing note and vitals reviewed. Constitutional: Appears well-developed and well-nourished. No distress.  HENT:  Head: Normocephalic and atraumatic.  Right Ear: External ear normal.  Left Ear: External ear normal.  Eyes: Conjunctivae are normal. No scleral icterus.  Neck: Neck supple. Carotid bruit is not present.  Cardiovascular: Normal rate, regular rhythm and normal heart sounds.  Exam reveals no gallop and no friction rub.   No murmur heard. Pulmonary/Chest: Effort normal and breath sounds normal. No respiratory distress. He has no wheezes. no rales.  Lymphadenopathy:    He has no cervical adenopathy.  Neurological:Alert.  Skin: Skin is warm and dry. Not diaphoretic.  Psychiatric: Has a normal mood and affect.        Assessment & Plan:

## 2012-12-05 ENCOUNTER — Ambulatory Visit (AMBULATORY_SURGERY_CENTER): Payer: BC Managed Care – PPO | Admitting: *Deleted

## 2012-12-05 ENCOUNTER — Encounter: Payer: Self-pay | Admitting: Internal Medicine

## 2012-12-05 VITALS — Ht 73.0 in | Wt 254.0 lb

## 2012-12-05 DIAGNOSIS — Z1211 Encounter for screening for malignant neoplasm of colon: Secondary | ICD-10-CM

## 2012-12-05 MED ORDER — MOVIPREP 100 G PO SOLR: ORAL | Status: DC

## 2012-12-12 ENCOUNTER — Encounter: Payer: Self-pay | Admitting: Internal Medicine

## 2012-12-12 ENCOUNTER — Ambulatory Visit (AMBULATORY_SURGERY_CENTER): Payer: BC Managed Care – PPO | Admitting: Internal Medicine

## 2012-12-12 VITALS — BP 143/77 | HR 61 | Temp 98.0°F | Resp 22 | Ht 73.0 in | Wt 254.0 lb

## 2012-12-12 DIAGNOSIS — D126 Benign neoplasm of colon, unspecified: Secondary | ICD-10-CM

## 2012-12-12 DIAGNOSIS — Z1211 Encounter for screening for malignant neoplasm of colon: Secondary | ICD-10-CM

## 2012-12-12 MED ORDER — SODIUM CHLORIDE 0.9 % IV SOLN: 500.0000 mL | INTRAVENOUS | Status: DC

## 2012-12-12 NOTE — Progress Notes (Signed)
Patient did not experience any of the following events: a burn prior to discharge; a fall within the facility; wrong site/side/patient/procedure/implant event; or a hospital transfer or hospital admission upon discharge from the facility. (G8907) Patient did not have preoperative order for IV antibiotic SSI prophylaxis. (G8918)  

## 2012-12-12 NOTE — Patient Instructions (Addendum)
YOU HAD AN ENDOSCOPIC PROCEDURE TODAY AT THE [facility]: Refer to the procedure report that was given to you for any specific questions about what was found during the examination.  If the procedure report does not answer your questions, please call your gastroenterologist to clarify.  If you requested that your care partner not be given the details of your procedure findings, then the procedure report has been included in a sealed envelope for you to review at your convenience later.  YOU SHOULD EXPECT: Some feelings of bloating in the abdomen. Passage of more gas than usual.  Walking can help get rid of the air that was put into your GI tract during the procedure and reduce the bloating. If you had a lower endoscopy (such as a colonoscopy or flexible sigmoidoscopy) you may notice spotting of blood in your stool or on the toilet paper. If you underwent a bowel prep for your procedure, then you may not have a normal bowel movement for a few days.  DIET: Your first meal following the procedure should be a light meal and then it is ok to progress to your normal diet.  A half-sandwich or bowl of soup is an example of a good first meal.  Heavy or fried foods are harder to digest and may make you feel nauseous or bloated.  Likewise meals heavy in dairy and vegetables can cause extra gas to form and this can also increase the bloating.  Drink plenty of fluids but you should avoid alcoholic beverages for 24 hours.  ACTIVITY: Your care partner should take you home directly after the procedure.  You should plan to take it easy, moving slowly for the rest of the day.  You can resume normal activity the day after the procedure however you should NOT DRIVE or use heavy machinery for 24 hours (because of the sedation medicines used during the test).    SYMPTOMS TO REPORT IMMEDIATELY: A gastroenterologist can be reached at any hour.  During normal business hours, 8:30 AM to 5:00 PM Monday through Friday,  call [phone].  After hours and on weekends, please call the GI answering service at [phone] who will take a message and have the physician on call contact you.   Following lower endoscopy (colonoscopy or flexible sigmoidoscopy):  Excessive amounts of blood in the stool  Significant tenderness or worsening of abdominal pains  Swelling of the abdomen that is new, acute  Fever of 100F or higher    FOLLOW UP: If any biopsies were taken you will be contacted by phone or by letter within the next 1-3 weeks.  Call your gastroenterologist if you have not heard about the biopsies in 3 weeks.  Our staff will call the home number listed on your records the next business day following your procedure to check on you and address any questions or concerns that you may have at that time regarding the information given to you following your procedure. This is a courtesy call and so if there is no answer at the home number and we have not heard from you through the emergency physician on call, we will assume that you have returned to your regular daily activities without incident.  SIGNATURES/CONFIDENTIALITY: You and/or your care partner have signed paperwork which will be entered into your electronic medical record.  These signatures attest to the fact that that the information above on your After Visit Summary has been reviewed and is understood.  Full responsibility of the confidentiality   of this discharge information lies with you and/or your care-partner.    Information on polyps given to you today 

## 2012-12-12 NOTE — Progress Notes (Signed)
Called to room to assist during endoscopic procedure.  Patient ID and intended procedure confirmed with present staff. Received instructions for my participation in the procedure from the performing physician.  

## 2012-12-12 NOTE — Op Note (Signed)
[facility] [address] [place], [address]   COLONOSCOPY PROCEDURE REPORT  PATIENT: [name], Joshua J.  MR#: [id number] BIRTHDATE: 1956/01/23 , 56  yrs. old GENDER: Male ENDOSCOPIST: Roxy Cedar, MD REFERRED XB:JYNWGN Rodena Medin, M.D. PROCEDURE DATE:  12/12/2012 PROCEDURE:   Colonoscopy with snare polypectomy    x 2 ASA CLASS:   Class II INDICATIONS:average risk screening. MEDICATIONS: MAC sedation, administered by CRNA and propofol (Diprivan) 350mg  IV  DESCRIPTION OF PROCEDURE:   After the risks benefits and alternatives of the procedure were thoroughly explained, informed consent was obtained.  A digital rectal exam revealed no abnormalities of the rectum.   The LB [id number]  endoscope was introduced through the anus and advanced to the cecum, which was identified by both the appendix and ileocecal valve. No adverse events experienced.   The quality of the prep was excellent, using MoviPrep  The instrument was then slowly withdrawn as the colon was fully examined.      COLON FINDINGS: Two polyps measuring 2 and 6 mm in size were found in the ascending and descending colon, repectively.  A polypectomy was performed with a cold snare.  The resection was complete and the polyp tissue was completely retrieved.   The colon mucosa was otherwise normal.  Retroflexed views revealed internal hemorrhoids. The time to cecum=1 minutes 49 seconds.  Withdrawal time=12 minutes 01 seconds.  The scope was withdrawn and the procedure completed. COMPLICATIONS: There were no complications.  ENDOSCOPIC IMPRESSION: 1.   Two polyps measuring 2 and 6 mm in size were found in the ascending colon and descending colon; polypectomy was performed with a cold snare 2.   The colon mucosa was otherwise normal  RECOMMENDATIONS: 1. Repeat colonoscopy in 5 years if polyp adenomatous; otherwise 10 years   eSigned:  Roxy Cedar, MD 12/12/2012 3:47 PM   cc: Clifton Custard MD and The Patient   PATIENT NAME:  [name], Joshua J. MR#: [id number]

## 2012-12-13 ENCOUNTER — Telehealth: Payer: Self-pay | Admitting: *Deleted

## 2012-12-13 NOTE — Telephone Encounter (Signed)
  Follow up Call-  Call back number 12/12/2012  Post procedure Call Back phone  # [phone]  Permission to leave phone message Yes     Patient questions:  Do you have a fever, pain , or abdominal swelling? no Pain Score  0 *  Have you tolerated food without any problems? yes  Have you been able to return to your normal activities? yes  Do you have any questions about your discharge instructions: Diet   no Medications  no Follow up visit  no  Do you have questions or concerns about your Care? no  Actions: * If pain score is 4 or above: No action needed, pain <4.

## 2012-12-19 ENCOUNTER — Encounter: Payer: Self-pay | Admitting: Internal Medicine

## 2012-12-23 ENCOUNTER — Other Ambulatory Visit: Payer: Self-pay | Admitting: Internal Medicine

## 2012-12-26 ENCOUNTER — Other Ambulatory Visit: Payer: Self-pay | Admitting: Internal Medicine

## 2012-12-27 NOTE — Telephone Encounter (Signed)
Current amlodipine request denied as refill was sent in on 12/23/12 #90 x no refills.

## 2013-01-05 ENCOUNTER — Ambulatory Visit: Payer: BC Managed Care – PPO | Admitting: Family Medicine

## 2013-01-06 ENCOUNTER — Ambulatory Visit: Payer: BC Managed Care – PPO | Admitting: Internal Medicine

## 2013-01-12 ENCOUNTER — Encounter: Payer: Self-pay | Admitting: Family Medicine

## 2013-01-12 ENCOUNTER — Ambulatory Visit (INDEPENDENT_AMBULATORY_CARE_PROVIDER_SITE_OTHER): Payer: BC Managed Care – PPO | Admitting: Family Medicine

## 2013-01-12 ENCOUNTER — Telehealth: Payer: Self-pay | Admitting: Family Medicine

## 2013-01-12 VITALS — BP 150/80 | HR 72 | Temp 98.6°F | Resp 16 | Wt 251.1 lb

## 2013-01-12 DIAGNOSIS — R739 Hyperglycemia, unspecified: Secondary | ICD-10-CM

## 2013-01-12 MED ORDER — PANTOPRAZOLE SODIUM 40 MG PO TBEC: 40.0000 mg | DELAYED_RELEASE_TABLET | Freq: Every day | ORAL | Status: DC

## 2013-01-12 NOTE — Patient Instructions (Signed)
Carpal Tunnel Syndrome You may have carpal tunnel syndrome. This is a common condition. Carpal tunnel syndrome occurs when the tendons, bones, or ligaments in the wrist press against the median nerve as it passes into the hand.  Symptoms can include:  Intermittent numbness.   Pain or a tingling sensation in thumb and first two fingers.  The pain may radiate up to the shoulder. There may even be weakness in the hand muscles. The pain is often worse at night and in the early morning. Nerve conduction tests may be used to prove the diagnosis. Carpal tunnel syndrome is most often due to repeated movements of the hand or wrist. Other causes can include:  Prior injuries.   Diabetes.   Obesity.   Smoking.   Pregnancy. Symptoms that develop during pregnancy often stop when the pregnancy is over.  Treatment includes:  Splinting - A wrist splint helps prevent movements that irritate the nerve. Splints are especially helpful at night when the symptoms are often worse.   Ice packs - Cold packs applied to the palm side of the wrist for 20 minutes every 2 hours while awake may give some relief.   Medication - Medicine to reduce inflammation and pain are often used. Cortisone injections around the nerve may also bring improvement.  Severe cases of carpal tunnel syndrome can require surgery to relieve the pressure on the nerve. This may be necessary if there is evidence of weakness or decreased sensation in your hand, or if your symptoms do not improve with conservative treatment. See your caregiver for follow-up to be certain your condition is improving. Document Released: 12/17/2004 Document Revised: 07/22/2011 Document Reviewed: 09/15/2007 [employer] Patient Information 2012 [employer]. 

## 2013-01-12 NOTE — Telephone Encounter (Signed)
Lab order week of 03-25-2013

## 2013-01-15 ENCOUNTER — Encounter: Payer: Self-pay | Admitting: Family Medicine

## 2013-01-15 NOTE — Assessment & Plan Note (Signed)
Has failed splinting, he is to add ice and aspercreme and is referred to ortho for further consideration

## 2013-01-15 NOTE — Progress Notes (Signed)
Patient ID: Joshua Little, male   DOB: 29-Jun-1956, 57 y.o.   MRN: [id number] Joshua Little [id number] 1956/09/22 01/15/2013      Progress Note-Follow Up  Subjective  Chief Complaint  Chief Complaint  Patient presents with  . Hypertension  . Carpal Tunnel    Pt reports worsening of symptoms despite use of bilateral wrist splints.    HPI  Patient is a 57 year old male in today for followup. Overall feels well. No recent illness. No fevers or chills. Her headaches, chest pain, palpitations, shortness of breath, GI or GU complaints. Has not been following a diet low in sodium. He notes his carpal tunnel symptoms persist. He has chronic pain and weakness in both hands. Despite splinting routinely his pain is not improving Past Medical History  Diagnosis Date  . Hypercholesterolemia   . GERD (gastroesophageal reflux disease)   . Hypertension   . ED (erectile dysfunction)   . Carpal tunnel syndrome     Past Surgical History  Procedure Laterality Date  . Total hip revision  2008     total hip replacement  . Umbilical hernia repair      as a baby    Family History  Problem Relation Age of Onset  . Gout Father   . Diabetes Sister   . Other      no history of colon cancer, prostate cancer, coronary artery disease    History   Social History  . Marital Status: Divorced    Spouse Name: N/A    Number of Children: N/A  . Years of Education: N/A   Occupational History  . Not on file.   Social History Main Topics  . Smoking status: Former Games developer  . Smokeless tobacco: Never Used     Comment: quit smoking in 2000-light smoker x 10 years  . Alcohol Use: 1.5 oz/week    3 drink(s) per week  . Drug Use: No  . Sexually Active: Not on file   Other Topics Concern  . 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 losartan (COZAAR) 50 MG tablet Take 1 tablet (50 mg total) by mouth daily.  30 tablet  6  . vardenafil (LEVITRA) 10 MG tablet Take 1 tablet (10 mg total) by mouth daily as needed for erectile dysfunction.  6 tablet  3   No current facility-administered medications on file prior to visit.    No Known Allergies  Review of Systems  Review of Systems  Constitutional: Negative for fever and malaise/fatigue.  HENT: Negative for congestion.   Eyes: Negative for discharge.  Respiratory: Negative for shortness of breath.   Cardiovascular: Negative for chest pain, palpitations and leg swelling.  Gastrointestinal: Negative for nausea, abdominal pain and diarrhea.  Genitourinary: Negative for dysuria.  Musculoskeletal: Positive for joint pain. Negative for falls.       B/l hands hurt and feel week at times.  Skin: Negative for rash.  Neurological: Negative for loss of consciousness and headaches.  Endo/Heme/Allergies: Negative for polydipsia.  Psychiatric/Behavioral: Negative for depression and  suicidal ideas. The patient is not nervous/anxious and does not have insomnia.     Objective  BP 150/80  Pulse 72  Temp(Src) 98.6 F (37 C) (Oral)  Resp 16  Wt 251 lb 1.3 oz (113.889 kg)  BMI 33.13 kg/m2  SpO2 97%  Physical Exam  Physical Exam  Constitutional: He is oriented to person, place, and time and well-developed, well-nourished, and in no distress. No distress.  HENT:  Head: Normocephalic and atraumatic.  Eyes: Conjunctivae are normal.  Neck: Neck supple. No thyromegaly present.  Cardiovascular: Normal rate, regular rhythm and normal heart sounds.   No murmur heard. Pulmonary/Chest: Effort normal and breath sounds normal. No respiratory distress.  Abdominal: He exhibits no  distension and no mass. There is no tenderness.  Musculoskeletal: He exhibits no edema.  Neurological: He is alert and oriented to person, place, and time.  Skin: Skin is warm.  Psychiatric: Memory, affect and judgment normal.    Lab Results  Component Value Date   TSH 1.880 07/09/2011   Lab Results  Component Value Date   WBC 5.3 07/09/2011   HGB 14.6 07/09/2011   HCT 43.5 07/09/2011   MCV 90.6 07/09/2011   PLT 203 07/09/2011   Lab Results  Component Value Date   CREATININE 1.23 10/28/2012   BUN 19 10/28/2012   NA 141 10/28/2012   K 4.4 10/28/2012   CL 108 10/28/2012   CO2 23 10/28/2012   Lab Results  Component Value Date   ALT 27 06/23/2012   AST 29 06/23/2012   ALKPHOS 84 06/23/2012   BILITOT 1.0 06/23/2012   Lab Results  Component Value Date   CHOL 199 10/28/2012   Lab Results  Component Value Date   HDL 54 10/28/2012   Lab Results  Component Value Date   LDLCALC 121* 10/28/2012   Lab Results  Component Value Date   TRIG 120 10/28/2012   Lab Results  Component Value Date   CHOLHDL 3.7 10/28/2012     Assessment & Plan  HYPERTENSION Mild elevation today, attempt the DASH diet, minimize sodium and caffeine.   HYPERCHOLESTEROLEMIA Minimize simple carbs and saturated fats, avoid trans fats  Carpal tunnel syndrome Has failed splinting, he is to add ice and aspercreme and is referred to ortho for further consideration

## 2013-01-15 NOTE — Assessment & Plan Note (Signed)
Mild elevation today, attempt the DASH diet, minimize sodium and caffeine.

## 2013-01-15 NOTE — Assessment & Plan Note (Signed)
Minimize simple carbs and saturated fats, avoid trans fats

## 2013-01-16 NOTE — Telephone Encounter (Signed)
Please advise what labs need to be ordered and the diagnosis codes please? I will then order

## 2013-01-16 NOTE — Telephone Encounter (Signed)
Labs hgba1c, lipid, renal, cbc, tsh, hepatic for HTN, hyperlipid, hyperglycemia

## 2013-01-20 ENCOUNTER — Other Ambulatory Visit: Payer: Self-pay | Admitting: Family Medicine

## 2013-01-20 NOTE — Telephone Encounter (Signed)
Labs ordered.

## 2013-04-13 ENCOUNTER — Encounter: Payer: Self-pay | Admitting: Family Medicine

## 2013-04-13 ENCOUNTER — Ambulatory Visit (INDEPENDENT_AMBULATORY_CARE_PROVIDER_SITE_OTHER): Payer: BC Managed Care – PPO | Admitting: Family Medicine

## 2013-04-13 VITALS — BP 152/92 | HR 65 | Temp 98.3°F | Ht 73.0 in | Wt 252.5 lb

## 2013-04-13 DIAGNOSIS — E785 Hyperlipidemia, unspecified: Secondary | ICD-10-CM

## 2013-04-13 DIAGNOSIS — E7800 Pure hypercholesterolemia, unspecified: Secondary | ICD-10-CM

## 2013-04-13 DIAGNOSIS — R7309 Other abnormal glucose: Secondary | ICD-10-CM

## 2013-04-13 DIAGNOSIS — I10 Essential (primary) hypertension: Secondary | ICD-10-CM

## 2013-04-13 DIAGNOSIS — R3915 Urgency of urination: Secondary | ICD-10-CM

## 2013-04-13 DIAGNOSIS — R739 Hyperglycemia, unspecified: Secondary | ICD-10-CM

## 2013-04-13 DIAGNOSIS — N529 Male erectile dysfunction, unspecified: Secondary | ICD-10-CM

## 2013-04-13 DIAGNOSIS — F528 Other sexual dysfunction not due to a substance or known physiological condition: Secondary | ICD-10-CM

## 2013-04-13 MED ORDER — VARDENAFIL HCL 10 MG PO TABS: 10.0000 mg | ORAL_TABLET | Freq: Every day | ORAL | Status: DC | PRN

## 2013-04-13 NOTE — Patient Instructions (Addendum)
Salon pas patches and/or Aspercreme twice a day and ice for 15 minutes twice a day, where  Splints at bed Cranberry tabs daily and or a probiotic such as Digestive Advantage daily or generic Krill oil caps such as MegaRed daily Change Aleve to 1 tab twice a day BP log q week x 4 weeks and call us with numbers  Next visit in 6 months, with labs ahead, lipid, renal, cb,c tsh, hepatic, hgba1c, annual exam  'Carpal Tunnel Syndrome You may have carpal tunnel syndrome. This is a common condition. Carpal tunnel syndrome occurs when the tendons, bones, or ligaments in the wrist press against the median nerve as it passes into the hand.  Symptoms can include:  Intermittent numbness.   Pain or a tingling sensation in thumb and first two fingers.  The pain may radiate up to the shoulder. There may even be weakness in the hand muscles. The pain is often worse at night and in the early morning. Nerve conduction tests may be used to prove the diagnosis. Carpal tunnel syndrome is most often due to repeated movements of the hand or wrist. Other causes can include:  Prior injuries.   Diabetes.   Obesity.   Smoking.   Pregnancy. Symptoms that develop during pregnancy often stop when the pregnancy is over.  Treatment includes:  Splinting - A wrist splint helps prevent movements that irritate the nerve. Splints are especially helpful at night when the symptoms are often worse.   Ice packs - Cold packs applied to the palm side of the wrist for 20 minutes every 2 hours while awake may give some relief.   Medication - Medicine to reduce inflammation and pain are often used. Cortisone injections around the nerve may also bring improvement.  Severe cases of carpal tunnel syndrome can require surgery to relieve the pressure on the nerve. This may be necessary if there is evidence of weakness or decreased sensation in your hand, or if your symptoms do not improve with conservative treatment. See your  caregiver for follow-up to be certain your condition is improving. Document Released: 12/17/2004 Document Revised: 07/22/2011 Document Reviewed: 09/15/2007 [facility] Patient Information 2012 [place], [place].

## 2013-04-14 LAB — URINALYSIS
Bilirubin Urine: NEGATIVE
Glucose, UA: NEGATIVE mg/dL
Hgb urine dipstick: NEGATIVE
Leukocytes, UA: NEGATIVE
Protein, ur: NEGATIVE mg/dL
pH: 6 (ref 5.0–8.0)

## 2013-04-14 LAB — HEPATIC FUNCTION PANEL
Albumin: 4.3 g/dL (ref 3.5–5.2)
Bilirubin, Direct: 0.2 mg/dL (ref 0.0–0.3)
Total Bilirubin: 1 mg/dL (ref 0.3–1.2)

## 2013-04-14 LAB — RENAL FUNCTION PANEL
Calcium: 9.6 mg/dL (ref 8.4–10.5)
Chloride: 107 mEq/L (ref 96–112)
Phosphorus: 3.7 mg/dL (ref 2.3–4.6)
Potassium: 4.2 mEq/L (ref 3.5–5.3)
Sodium: 140 mEq/L (ref 135–145)

## 2013-04-14 LAB — CBC
HCT: 43.9 % (ref 39.0–52.0)
MCHC: 34.9 g/dL (ref 30.0–36.0)
MCV: 85.7 fL (ref 78.0–100.0)
Platelets: 218 10*3/uL (ref 150–400)
RDW: 14.3 % (ref 11.5–15.5)

## 2013-04-14 LAB — LIPID PANEL
Cholesterol: 214 mg/dL — ABNORMAL HIGH (ref 0–200)
HDL: 50 mg/dL (ref >39)
Total CHOL/HDL Ratio: 4.3 Ratio

## 2013-04-14 LAB — TSH: TSH: 2.225 u[IU]/mL (ref 0.350–4.500)

## 2013-04-14 LAB — HEMOGLOBIN A1C: Hgb A1c MFr Bld: 6 % — ABNORMAL HIGH (ref <5.7)

## 2013-04-15 LAB — URINE CULTURE: Colony Count: NO GROWTH

## 2013-04-18 ENCOUNTER — Encounter: Payer: Self-pay | Admitting: Family Medicine

## 2013-04-18 MED ORDER — SIMVASTATIN 10 MG PO TABS: 10.0000 mg | ORAL_TABLET | Freq: Every day | ORAL | Status: DC

## 2013-04-18 NOTE — Progress Notes (Signed)
Quick Note:  Patient Informed and voiced understanding.  Pt willing to start Simvastatin (RX sent) and pt states he has started krill oil ______

## 2013-04-18 NOTE — Progress Notes (Signed)
Patient ID: Joshua Little, male   DOB: [birth year], 57 y.o.   MRN: [id number] KHAMERON GRUENWALD [id number] 09-Feb-1956 04/18/2013      Progress Note-Follow Up  Subjective  Chief Complaint  Chief Complaint  Patient presents with  . Follow-up    3 month    HPI  Patient is a 57 year old in today for followup. Feeling well. No recent illness, no fevers, HA, CP, palp, SOB, GI c/o. Is taking meds as prescribed. Is struggling with urinary hesitancy, very slight. No dysuria or incontinence. Stable over time. Reflux generally well controlled at this time  Past Medical History  Diagnosis Date  . Hypercholesterolemia   . GERD (gastroesophageal reflux disease)   . Hypertension   . ED (erectile dysfunction)   . Carpal tunnel syndrome     Past Surgical History  Procedure Laterality Date  . Total hip revision  2008     total hip replacement  . Umbilical hernia repair      as a baby    Family History  Problem Relation Age of Onset  . Gout Father   . Diabetes Sister   . Other      no history of colon cancer, prostate cancer, coronary artery disease    History   Social History  . Marital Status: Divorced    Spouse Name: N/A    Number of Children: N/A  . Years of Education: N/A   Occupational History  . Not on file.   Social History Main Topics  . Smoking status: Former Games developer  . Smokeless tobacco: Never Used     Comment: quit smoking in 2000-light smoker x 10 years  . Alcohol Use: 1.5 oz/week    3 drink(s) per week  . Drug Use: No  . Sexually Active: Not on file   Other Topics Concern  . 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 losartan (COZAAR) 50 MG tablet Take 1 tablet (50 mg total) by mouth daily.  30 tablet  6  . pantoprazole (PROTONIX) 40 MG tablet Take 1 tablet (40 mg total) by mouth daily.  30 tablet  3   No current facility-administered medications on file prior to visit.    No Known Allergies  Review of Systems  Review of Systems  Constitutional: Negative for fever and malaise/fatigue.  HENT: Negative for congestion.   Eyes: Negative for discharge.  Respiratory: Negative for shortness of breath.   Cardiovascular: Negative for chest pain, palpitations and leg swelling.  Gastrointestinal: Negative for nausea, abdominal pain and diarrhea.  Genitourinary: Negative for dysuria.  Musculoskeletal: Negative for falls.  Skin: Negative for rash.  Neurological: Negative for loss of consciousness and headaches.  Endo/Heme/Allergies: Negative for polydipsia.  Psychiatric/Behavioral: Negative for depression and suicidal ideas. The patient is not nervous/anxious and does not have insomnia.     Objective  BP 152/92  Pulse 65  Temp(Src) 98.3 F (36.8 C) (Oral)  Ht 6\' 1"  (1.854 m)  Wt 252 lb 8 oz (114.533 kg)  BMI 33.32 kg/m2  SpO2  96%  Physical Exam  Physical Exam  Constitutional: He is oriented to person, place, and time and well-developed, well-nourished, and in no distress. No distress.  HENT:  Head: Normocephalic and atraumatic.  Eyes: Conjunctivae are normal.  Neck: Neck supple. No thyromegaly present.  Cardiovascular: Normal rate, regular rhythm and normal heart sounds.   No murmur heard. Pulmonary/Chest: Effort normal and breath sounds normal. No respiratory distress.  Abdominal: He exhibits no distension and no mass. There is no tenderness.  Musculoskeletal: He exhibits no edema.  Neurological: He is alert and oriented to person, place, and time.  Skin: Skin is warm.  Psychiatric: Memory, affect and judgment normal.    Lab Results  Component Value  Date   TSH 2.225 04/13/2013   Lab Results  Component Value Date   WBC 3.8* 04/13/2013   HGB 15.3 04/13/2013   HCT 43.9 04/13/2013   MCV 85.7 04/13/2013   PLT 218 04/13/2013   Lab Results  Component Value Date   CREATININE 1.23 04/13/2013   BUN 18 04/13/2013   NA 140 04/13/2013   K 4.2 04/13/2013   CL 107 04/13/2013   CO2 26 04/13/2013   Lab Results  Component Value Date   ALT 23 04/13/2013   AST 27 04/13/2013   ALKPHOS 70 04/13/2013   BILITOT 1.0 04/13/2013   Lab Results  Component Value Date   CHOL 214* 04/13/2013   Lab Results  Component Value Date   HDL 50 04/13/2013   Lab Results  Component Value Date   LDLCALC 108* 04/13/2013   Lab Results  Component Value Date   TRIG 281* 04/13/2013   Lab Results  Component Value Date   CHOLHDL 4.3 04/13/2013     Assessment & Plan  HYPERTENSION Mild elevation, continue Losartan for now and given information on the DASH diet. Reassess at next visit.  ERECTILE DYSFUNCTION Follows with [facility]  Hyperglycemia hgba1c 6.0 down from 6.1 encouraged DASH diet and avoid simple carbs.  HYPERCHOLESTEROLEMIA Avoid trans fats, minimize simple carbs and saturated fats.

## 2013-04-18 NOTE — Assessment & Plan Note (Signed)
Follows with [facility]

## 2013-04-18 NOTE — Assessment & Plan Note (Signed)
hgba1c 6.0 down from 6.1 encouraged DASH diet and avoid simple carbs.

## 2013-04-18 NOTE — Assessment & Plan Note (Signed)
Mild elevation, continue Losartan for now and given information on the DASH diet. Reassess at next visit.

## 2013-04-18 NOTE — Assessment & Plan Note (Signed)
Avoid trans fats, minimize simple carbs and saturated fats.  

## 2013-06-20 ENCOUNTER — Telehealth: Payer: Self-pay | Admitting: *Deleted

## 2013-06-20 MED ORDER — TADALAFIL 5 MG PO TABS: 5.0000 mg | ORAL_TABLET | Freq: Every day | ORAL | Status: DC | PRN

## 2013-06-20 NOTE — Telephone Encounter (Signed)
OK to send in rx for Cialis 5 mg po daily, disp #30 with 1 rf but warn him long term Viagra is likely to be cheaper

## 2013-06-20 NOTE — Telephone Encounter (Signed)
Patient informed and voiced understanding  RX sent to pharmacy 

## 2013-06-20 NOTE — Telephone Encounter (Signed)
Also make sure he knows he does not have to take the Cialis daily as rx says but can take 1-2 tabs po daily as needed for ED

## 2013-06-20 NOTE — Telephone Encounter (Signed)
Pt called requesting a cheaper alternative for levitra. Pt's pharmacy told him he can get a 30 day free trial of cialis 5mg  if we call it in. Please advise.

## 2013-06-22 ENCOUNTER — Telehealth: Payer: Self-pay | Admitting: *Deleted

## 2013-06-22 NOTE — Telephone Encounter (Signed)
Received message from pt requesting refills but pt did not specify which medications. Left message for pt to return my call.

## 2013-06-26 ENCOUNTER — Other Ambulatory Visit: Payer: Self-pay | Admitting: *Deleted

## 2013-06-26 MED ORDER — LOSARTAN POTASSIUM 50 MG PO TABS: 50.0000 mg | ORAL_TABLET | Freq: Every day | ORAL | Status: DC

## 2013-06-26 NOTE — Telephone Encounter (Signed)
Patient needed refill on losartan. Rx sent into pharmacy.

## 2013-10-11 ENCOUNTER — Ambulatory Visit: Payer: BC Managed Care – PPO | Admitting: Family Medicine

## 2013-10-11 DIAGNOSIS — Z0289 Encounter for other administrative examinations: Secondary | ICD-10-CM

## 2013-10-13 ENCOUNTER — Ambulatory Visit: Payer: BC Managed Care – PPO | Admitting: Family Medicine

## 2014-01-22 ENCOUNTER — Encounter: Payer: Self-pay | Admitting: Family

## 2014-01-22 ENCOUNTER — Ambulatory Visit (INDEPENDENT_AMBULATORY_CARE_PROVIDER_SITE_OTHER): Payer: 59 | Admitting: Family

## 2014-01-22 VITALS — BP 124/88 | HR 76 | Temp 99.1°F | Ht 73.0 in | Wt 234.1 lb

## 2014-01-22 DIAGNOSIS — R6883 Chills (without fever): Secondary | ICD-10-CM

## 2014-01-22 DIAGNOSIS — A088 Other specified intestinal infections: Secondary | ICD-10-CM

## 2014-01-22 DIAGNOSIS — A084 Viral intestinal infection, unspecified: Secondary | ICD-10-CM | POA: Insufficient documentation

## 2014-01-22 LAB — POCT INFLUENZA A/B
Influenza A, POC: NEGATIVE
Influenza B, POC: NEGATIVE

## 2014-01-22 NOTE — Progress Notes (Signed)
Subjective:    Patient ID: Joshua Little, male    DOB: Jan 24, 1956, 58 y.o.   MRN: [id number]  HPI  Joshua Little is a 58 yr old male who presents today with chief complaint of diarrhea. Reports that symptoms started on Friday night 2/27.  Had associated nausea with one episode of vomiting on Saturday.  Reports mild abdominal cramping.  Reports mild rhinorrhea. Wife reports tactile temp on Saturday.  Tolerating PO's but having multiple loose BM's. Denies sick contacts. Did not have a flu shot.    Review of Systems See HPI  Past Medical History  Diagnosis Date  . Hypercholesterolemia   . GERD (gastroesophageal reflux disease)   . Hypertension   . ED (erectile dysfunction)   . Carpal tunnel syndrome     History   Social History  . Marital Status: Divorced    Spouse Name: N/A    Number of Children: N/A  . Years of Education: N/A   Occupational History  . Not on file.   Social History Main Topics  . Smoking status: Former Research scientist (life sciences)  . Smokeless tobacco: Never Used     Comment: quit smoking in 2000-light smoker x 10 years  . Alcohol Use: 1.5 oz/week    3 drink(s) per week  . Drug Use: No  . Sexual Activity: Not on file   Other Topics Concern  . Not on file   Social History Narrative   5 children - grown   Single -   Divorced   Occupation: Architectural technologist   quit smoking 2000 - light smoker x 10 yrs   Alcohol use-yes (3-4 drinks per week)   Occupation: Therapist, music for [employer]   Caffeine use/day:  3-4 times per week          Past Surgical History  Procedure Laterality Date  . Total hip revision  2008     total hip replacement  . Umbilical hernia repair      as a baby    Family History  Problem Relation Age of Onset  . Gout Father   . Diabetes Sister   . Other      no history of colon cancer, prostate cancer, coronary artery disease    No Known Allergies  Current Outpatient Prescriptions on File Prior to Visit  Medication Sig  Dispense Refill  . KRILL OIL PO Take by mouth.      . losartan (COZAAR) 50 MG tablet Take 1 tablet (50 mg total) by mouth daily.  30 tablet  5  . pantoprazole (PROTONIX) 40 MG tablet Take 1 tablet (40 mg total) by mouth daily.  30 tablet  3  . simvastatin (ZOCOR) 10 MG tablet Take 1 tablet (10 mg total) by mouth at bedtime.  30 tablet  3  . tadalafil (CIALIS) 5 MG tablet Take 1 tablet (5 mg total) by mouth daily as needed for erectile dysfunction.  30 tablet  1  . vardenafil (LEVITRA) 10 MG tablet Take 1 tablet (10 mg total) by mouth daily as needed for erectile dysfunction.  6 tablet  3  . tadalafil (CIALIS) 5 MG tablet Take 1 tablet (5 mg total) by mouth daily as needed for erectile dysfunction.  30 tablet  0   No current facility-administered medications on file prior to visit.    BP 124/88  Pulse 76  Temp(Src) 99.1 F (37.3 C) (Oral)  Ht 6\' 1"  (1.854 m)  Wt 234 lb 1.9 oz (106.196 kg)  BMI 30.90  kg/m2  SpO2 99%       Objective:   Physical Exam  Constitutional: He is oriented to person, place, and time. He appears well-developed and well-nourished. No distress.  HENT:  Head: Normocephalic.  Right Ear: Tympanic membrane and ear canal normal.  Left Ear: Tympanic membrane and ear canal normal.  Mouth/Throat: No oropharyngeal exudate, posterior oropharyngeal edema or posterior oropharyngeal erythema.  Cardiovascular: Normal rate and regular rhythm.   No murmur heard. Pulmonary/Chest: Effort normal and breath sounds normal. No respiratory distress. He has no wheezes. He has no rales.  Abdominal: Soft. He exhibits no distension. Bowel sounds are decreased. There is no tenderness. There is no rebound.  Musculoskeletal: He exhibits no edema.  Neurological: He is alert and oriented to person, place, and time.  Psychiatric: He has a normal mood and affect. His behavior is normal. Judgment and thought content normal.          Assessment & Plan:

## 2014-01-22 NOTE — Patient Instructions (Signed)
Call if symptoms worsen, if you develop fever >101, abdominal pain or if symptoms are not improved in 2-3 days. Drink plenty of fluids. You may use immodium as needed for diarrhea.

## 2014-01-22 NOTE — Assessment & Plan Note (Signed)
58 yr old male with symptoms most consistent with viral gastroenteritis. Rapid flu testing is negative. Recommended adequate hydration, follow up if symptoms worsen, or if not improved in 2-3 days.  Pt verbalizes understanding.

## 2014-01-22 NOTE — Progress Notes (Signed)
Pre visit review using our clinic review tool, if applicable. No additional management support is needed unless otherwise documented below in the visit note. 

## 2014-03-07 NOTE — Telephone Encounter (Signed)
Lab order

## 2014-03-30 ENCOUNTER — Other Ambulatory Visit: Payer: Self-pay | Admitting: Family

## 2014-04-09 ENCOUNTER — Ambulatory Visit (HOSPITAL_BASED_OUTPATIENT_CLINIC_OR_DEPARTMENT_OTHER)
Admission: RE | Admit: 2014-04-09 | Discharge: 2014-04-09 | Disposition: A | Discharge status: Home / Self Care | Payer: 59 | Source: Ambulatory Visit | Attending: Family Medicine | Admitting: Family Medicine

## 2014-04-09 ENCOUNTER — Ambulatory Visit (INDEPENDENT_AMBULATORY_CARE_PROVIDER_SITE_OTHER): Payer: 59 | Admitting: Family Medicine

## 2014-04-09 ENCOUNTER — Encounter: Payer: Self-pay | Admitting: Family Medicine

## 2014-04-09 VITALS — BP 144/90 | HR 64 | Temp 98.3°F | Ht 73.0 in | Wt 239.1 lb

## 2014-04-09 DIAGNOSIS — I10 Essential (primary) hypertension: Secondary | ICD-10-CM

## 2014-04-09 DIAGNOSIS — IMO0002 Reserved for concepts with insufficient information to code with codable children: Secondary | ICD-10-CM | POA: Insufficient documentation

## 2014-04-09 DIAGNOSIS — X500XXA Overexertion from strenuous movement or load, initial encounter: Secondary | ICD-10-CM | POA: Insufficient documentation

## 2014-04-09 DIAGNOSIS — M47817 Spondylosis without myelopathy or radiculopathy, lumbosacral region: Secondary | ICD-10-CM | POA: Insufficient documentation

## 2014-04-09 DIAGNOSIS — M545 Low back pain, unspecified: Secondary | ICD-10-CM

## 2014-04-09 DIAGNOSIS — K219 Gastro-esophageal reflux disease without esophagitis: Secondary | ICD-10-CM

## 2014-04-09 MED ORDER — NAPROXEN 375 MG PO TABS: 375.0000 mg | ORAL_TABLET | Freq: Two times a day (BID) | ORAL | Status: DC

## 2014-04-09 MED ORDER — METHOCARBAMOL 500 MG PO TABS: 500.0000 mg | ORAL_TABLET | Freq: Three times a day (TID) | ORAL | Status: DC | PRN

## 2014-04-09 NOTE — Progress Notes (Signed)
Patient ID: Joshua Little, male   DOB: 06-24-1956, 58 y.o.   MRN: [id number] Joshua Little [id number] Dec 11, 1955 04/09/2014      Progress Note-Follow Up  Subjective  Chief Complaint  Chief Complaint  Patient presents with  . Leg Pain    pain running down both legs X started 1 month ago off and on and the last few days its been constant    HPI  Patient is a 58 year old male in today for routine medical care. He is complaining about low back pain with radicular symptoms. He denies any falls or trauma. He denies incontinence. He reports the pain is something he has experienced in the past but it is lasting longer at this time than usual. Has a constant low back pain with pain leading to bilateral posterior hips and down the back of both legs stops at bedtime.the cath. No numbness tingling or weakness. Denies CP/palp/SOB/HA/congestion/fevers/GI or GU c/o. Taking meds as prescribed  Past Medical History  Diagnosis Date  . Hypercholesterolemia   . GERD (gastroesophageal reflux disease)   . Hypertension   . ED (erectile dysfunction)   . Carpal tunnel syndrome     Past Surgical History  Procedure Laterality Date  . Total hip revision  2008     total hip replacement  . Umbilical hernia repair      as a baby    Family History  Problem Relation Age of Onset  . Gout Father   . Diabetes Sister   . Other      no history of colon cancer, prostate cancer, coronary artery disease    History   Social History  . Marital Status: Divorced    Spouse Name: N/A    Number of Children: N/A  . Years of Education: N/A   Occupational History  . Not on file.   Social History Main Topics  . Smoking status: Former Research scientist (life sciences)  . Smokeless tobacco: Never Used     Comment: quit smoking in 2000-light smoker x 10 years  . Alcohol Use: 1.5 oz/week    3 drink(s) per week  . Drug Use: No  . Sexual Activity: Not on file   Other Topics Concern  . Not on file   Social History Narrative   5 children - grown   Single -   Divorced   Occupation: Architectural technologist   quit smoking 2000 - light smoker x 10 yrs   Alcohol use-yes (3-4 drinks per week)   Occupation: Therapist, music for [employer]   Caffeine use/day:  3-4 times per week          Current Outpatient Prescriptions on File Prior to Visit  Medication Sig Dispense Refill  . KRILL OIL PO Take by mouth.      . losartan (COZAAR) 50 MG tablet TAKE ONE TABLET BY MOUTH DAILY  30 tablet  0  . pantoprazole (PROTONIX) 40 MG tablet Take 1 tablet (40 mg total) by mouth daily.  30 tablet  3  . simvastatin (ZOCOR) 10 MG tablet Take 1 tablet (10 mg total) by mouth at bedtime.  30 tablet  3  . vardenafil (LEVITRA) 10 MG tablet Take 1 tablet (10 mg total) by mouth daily as needed for erectile dysfunction.  6 tablet  3   No current facility-administered medications on file prior to visit.    No Known Allergies  Review of Systems  Review of Systems  Constitutional: Negative for fever and malaise/fatigue.  HENT: Negative for  congestion.   Eyes: Negative for discharge.  Respiratory: Negative for shortness of breath.   Cardiovascular: Negative for chest pain, palpitations and leg swelling.  Gastrointestinal: Negative for nausea, abdominal pain and diarrhea.  Genitourinary: Negative for dysuria, urgency, frequency, hematuria and flank pain.  Musculoskeletal: Negative for falls.  Skin: Negative for rash.  Neurological: Negative for loss of consciousness and headaches.  Endo/Heme/Allergies: Negative for polydipsia.  Psychiatric/Behavioral: Negative for depression and suicidal ideas. The patient is not nervous/anxious and does not have insomnia.     Objective  BP 144/90  Pulse 64  Temp(Src) 98.3 F (36.8 C) (Oral)  Ht 6\' 1"  (1.854 m)  Wt 239 lb 1.3 oz (108.446 kg)  BMI 31.55 kg/m2  SpO2 98%  Physical Exam  Physical Exam  Constitutional: He is oriented to person, place, and time and well-developed,  well-nourished, and in no distress. No distress.  HENT:  Head: Normocephalic and atraumatic.  Eyes: Conjunctivae are normal.  Neck: Neck supple. No thyromegaly present.  Cardiovascular: Normal rate, regular rhythm and normal heart sounds.   No murmur heard. Pulmonary/Chest: Effort normal and breath sounds normal. No respiratory distress.  Abdominal: He exhibits no distension and no mass. There is no tenderness.  Musculoskeletal: He exhibits no edema.  Neurological: He is alert and oriented to person, place, and time.  Skin: Skin is warm.  Psychiatric: Memory, affect and judgment normal.    Lab Results  Component Value Date   TSH 2.225 04/13/2013   Lab Results  Component Value Date   WBC 3.8* 04/13/2013   HGB 15.3 04/13/2013   HCT 43.9 04/13/2013   MCV 85.7 04/13/2013   PLT 218 04/13/2013   Lab Results  Component Value Date   CREATININE 1.23 04/13/2013   BUN 18 04/13/2013   NA 140 04/13/2013   K 4.2 04/13/2013   CL 107 04/13/2013   CO2 26 04/13/2013   Lab Results  Component Value Date   ALT 23 04/13/2013   AST 27 04/13/2013   ALKPHOS 70 04/13/2013   BILITOT 1.0 04/13/2013   Lab Results  Component Value Date   CHOL 214* 04/13/2013   Lab Results  Component Value Date   HDL 50 04/13/2013   Lab Results  Component Value Date   LDLCALC 108* 04/13/2013   Lab Results  Component Value Date   TRIG 281* 04/13/2013   Lab Results  Component Value Date   CHOLHDL 4.3 04/13/2013     Assessment & Plan    HYPERTENSION Poorly controlled with pain, encouraged DASH diet, minimize caffeine and obtain adequate sleep. Report concerning symptoms and follow up as directed and as needed  GERD Avoid offending foods, start probiotics. Do not eat large meals in late evening and consider raising head of bed.   Low back pain Encouraged moist heat and gentle stretching as tolerated. May try NSAIDs and prescription meds as directed and report if symptoms worsen or seek immediate care. Given rx  for Robaxin and Naproxen prn

## 2014-04-09 NOTE — Progress Notes (Signed)
Pre visit review using our clinic review tool, if applicable. No additional management support is needed unless otherwise documented below in the visit note. 

## 2014-04-09 NOTE — Patient Instructions (Signed)
psa with next labs    Sciatica Sciatica is pain, weakness, numbness, or tingling along the path of the sciatic nerve. The nerve starts in the lower back and runs down the back of each leg. The nerve controls the muscles in the lower leg and in the back of the knee, while also providing sensation to the back of the thigh, lower leg, and the sole of your foot. Sciatica is a symptom of another medical condition. For instance, nerve damage or certain conditions, such as a herniated disk or bone spur on the spine, pinch or put pressure on the sciatic nerve. This causes the pain, weakness, or other sensations normally associated with sciatica. Generally, sciatica only affects one side of the body. CAUSES   Herniated or slipped disc.  Degenerative disk disease.  A pain disorder involving the narrow muscle in the buttocks (piriformis syndrome).  Pelvic injury or fracture.  Pregnancy.  Tumor (rare). SYMPTOMS  Symptoms can vary from mild to very severe. The symptoms usually travel from the low back to the buttocks and down the back of the leg. Symptoms can include:  Mild tingling or dull aches in the lower back, leg, or hip.  Numbness in the back of the calf or sole of the foot.  Burning sensations in the lower back, leg, or hip.  Sharp pains in the lower back, leg, or hip.  Leg weakness.  Severe back pain inhibiting movement. These symptoms may get worse with coughing, sneezing, laughing, or prolonged sitting or standing. Also, being overweight may worsen symptoms. DIAGNOSIS  Your caregiver will perform a physical exam to look for common symptoms of sciatica. He or she may ask you to do certain movements or activities that would trigger sciatic nerve pain. Other tests may be performed to find the cause of the sciatica. These may include:  Blood tests.  X-rays.  Imaging tests, such as an MRI or CT scan. TREATMENT  Treatment is directed at the cause of the sciatic pain. Sometimes,  treatment is not necessary and the pain and discomfort goes away on its own. If treatment is needed, your caregiver may suggest:  Over-the-counter medicines to relieve pain.  Prescription medicines, such as anti-inflammatory medicine, muscle relaxants, or narcotics.  Applying heat or ice to the painful area.  Steroid injections to lessen pain, irritation, and inflammation around the nerve.  Reducing activity during periods of pain.  Exercising and stretching to strengthen your abdomen and improve flexibility of your spine. Your caregiver may suggest losing weight if the extra weight makes the back pain worse.  Physical therapy.  Surgery to eliminate what is pressing or pinching the nerve, such as a bone spur or part of a herniated disk. HOME CARE INSTRUCTIONS   Only take over-the-counter or prescription medicines for pain or discomfort as directed by your caregiver.  Apply ice to the affected area for 20 minutes, 3 4 times a day for the first 48 72 hours. Then try heat in the same way.  Exercise, stretch, or perform your usual activities if these do not aggravate your pain.  Attend physical therapy sessions as directed by your caregiver.  Keep all follow-up appointments as directed by your caregiver.  Do not wear high heels or shoes that do not provide proper support.  Check your mattress to see if it is too soft. A firm mattress may lessen your pain and discomfort. SEEK IMMEDIATE MEDICAL CARE IF:   You lose control of your bowel or bladder (incontinence).  You  have increasing weakness in the lower back, pelvis, buttocks, or legs.  You have redness or swelling of your back.  You have a burning sensation when you urinate.  You have pain that gets worse when you lie down or awakens you at night.  Your pain is worse than you have experienced in the past.  Your pain is lasting longer than 4 weeks.  You are suddenly losing weight without reason. MAKE SURE  YOU:  Understand these instructions.  Will watch your condition.  Will get help right away if you are not doing well or get worse. Document Released: 11/03/2001 Document Revised: 05/10/2012 Document Reviewed: 03/20/2012 [facility] Patient Information 2014 [place]. Lumbosacral Strain Lumbosacral strain is a strain of any of the parts that make up your lumbosacral vertebrae. Your lumbosacral vertebrae are the bones that make up the lower third of your backbone. Your lumbosacral vertebrae are held together by muscles and tough, fibrous tissue (ligaments).  CAUSES  A sudden blow to your back can cause lumbosacral strain. Also, anything that causes an excessive stretch of the muscles in the low back can cause this strain. This is typically seen when people exert themselves strenuously, fall, lift heavy objects, bend, or crouch repeatedly. RISK FACTORS  Physically demanding work.  Participation in pushing or pulling sports or sports that require sudden twist of the back (tennis, golf, baseball).  Weight lifting.  Excessive lower back curvature.  Forward-tilted pelvis.  Weak back or abdominal muscles or both.  Tight hamstrings. SIGNS AND SYMPTOMS  Lumbosacral strain may cause pain in the area of your injury or pain that moves (radiates) down your leg.  DIAGNOSIS Your health care provider can often diagnose lumbosacral strain through a physical exam. In some cases, you may need tests such as X-ray exams.  TREATMENT  Treatment for your lower back injury depends on many factors that your clinician will have to evaluate. However, most treatment will include the use of anti-inflammatory medicines. HOME CARE INSTRUCTIONS   Avoid hard physical activities (tennis, racquetball, waterskiing) if you are not in proper physical condition for it. This may aggravate or create problems.  If you have a back problem, avoid sports requiring sudden body movements. Swimming and walking are generally  safer activities.  Maintain good posture.  Maintain a healthy weight.  For acute conditions, you may put ice on the injured area.  Put ice in a plastic bag.  Place a towel between your skin and the bag.  Leave the ice on for 20 minutes, 2 3 times a day.  When the low back starts healing, stretching and strengthening exercises may be recommended. SEEK MEDICAL CARE IF:  Your back pain is getting worse.  You experience severe back pain not relieved with medicines. SEEK IMMEDIATE MEDICAL CARE IF:   You have numbness, tingling, weakness, or problems with the use of your arms or legs.  There is a change in bowel or bladder control.  You have increasing pain in any area of the body, including your belly (abdomen).  You notice shortness of breath, dizziness, or feel faint.  You feel sick to your stomach (nauseous), are throwing up (vomiting), or become sweaty.  You notice discoloration of your toes or legs, or your feet get very cold. MAKE SURE YOU:   Understand these instructions.  Will watch your condition.  Will get help right away if you are not doing well or get worse. Document Released: 08/19/2005 Document Revised: 08/30/2013 Document Reviewed: 06/28/2013 [employer] Patient Information 2014  [employer].

## 2014-04-11 DIAGNOSIS — M545 Low back pain, unspecified: Secondary | ICD-10-CM | POA: Insufficient documentation

## 2014-04-11 NOTE — Assessment & Plan Note (Signed)
Encouraged moist heat and gentle stretching as tolerated. May try NSAIDs and prescription meds as directed and report if symptoms worsen or seek immediate care. Given rx for Robaxin and Naproxen prn

## 2014-04-11 NOTE — Assessment & Plan Note (Signed)
Poorly controlled with pain, encouraged DASH diet, minimize caffeine and obtain adequate sleep. Report concerning symptoms and follow up as directed and as needed

## 2014-04-11 NOTE — Assessment & Plan Note (Signed)
Avoid offending foods, start probiotics. Do not eat large meals in late evening and consider raising head of bed.  

## 2014-04-26 ENCOUNTER — Telehealth: Payer: Self-pay

## 2014-04-26 NOTE — Telephone Encounter (Signed)
Can try Tramadol bid prn pain, disp #30,

## 2014-04-26 NOTE — Telephone Encounter (Signed)
Naproxen isn't working and causing acid reflux issues?  Please advise?

## 2014-04-27 MED ORDER — TRAMADOL HCL 50 MG PO TABS: 50.0000 mg | ORAL_TABLET | Freq: Two times a day (BID) | ORAL | Status: DC | PRN

## 2014-04-27 NOTE — Telephone Encounter (Signed)
RX sent and patient informed.  

## 2014-05-16 ENCOUNTER — Other Ambulatory Visit: Payer: Self-pay | Admitting: Family Medicine

## 2014-05-16 ENCOUNTER — Telehealth: Payer: Self-pay | Admitting: Family Medicine

## 2014-05-16 DIAGNOSIS — R52 Pain, unspecified: Secondary | ICD-10-CM

## 2014-05-16 MED ORDER — TRAMADOL HCL 50 MG PO TABS: 50.0000 mg | ORAL_TABLET | Freq: Two times a day (BID) | ORAL | Status: DC | PRN

## 2014-05-16 NOTE — Telephone Encounter (Signed)
Please see previous note, I did print and sign his Tramadol, it is on the desk in nursing station

## 2014-05-16 NOTE — Telephone Encounter (Signed)
He can look up Joshua Little back exercise, or I can refer him to PT. OK to refill the Tramadol with the same sig same strength, #30

## 2014-05-16 NOTE — Telephone Encounter (Signed)
Pt is still having back pain. Is there any exercises he could do? Requesting refill on pain meds. Please advise.

## 2014-05-17 NOTE — Telephone Encounter (Addendum)
Patient stated that he will try the [facility] back exercises. Patient also stated that tramadol has really helped.

## 2014-05-17 NOTE — Telephone Encounter (Signed)
Rx request faxed to pharmacy/[facility]  

## 2014-09-06 ENCOUNTER — Encounter: Payer: 59 | Admitting: Family Medicine

## 2015-09-17 ENCOUNTER — Ambulatory Visit
Admission: RE | Admit: 2015-09-17 | Discharge: 2015-09-17 | Disposition: A | Discharge status: Home / Self Care | Payer: Self-pay | Source: Ambulatory Visit | Attending: Family Medicine | Admitting: Family Medicine

## 2015-09-17 ENCOUNTER — Other Ambulatory Visit: Payer: Self-pay | Admitting: Family Medicine

## 2015-09-17 DIAGNOSIS — M79604 Pain in right leg: Secondary | ICD-10-CM

## 2015-09-18 ENCOUNTER — Ambulatory Visit
Admission: RE | Admit: 2015-09-18 | Discharge: 2015-09-18 | Disposition: A | Discharge status: Home / Self Care | Payer: 59 | Source: Ambulatory Visit | Attending: Family Medicine | Admitting: Family Medicine

## 2015-09-18 ENCOUNTER — Other Ambulatory Visit: Payer: Self-pay | Admitting: Family Medicine

## 2015-09-18 DIAGNOSIS — M7989 Other specified soft tissue disorders: Secondary | ICD-10-CM

## 2015-09-18 DIAGNOSIS — R52 Pain, unspecified: Secondary | ICD-10-CM

## 2016-07-29 IMAGING — CR DG TIBIA/FIBULA 2V*R*
4 series · 4 of 4 positions shown · non-contrast
Comparison: None.

CLINICAL DATA: Riding lawnmower fell from trailer striking RIGHT
leg x 3 weeks ago / c/o pain and episodes of tibial swelling from
knee to ankle since / concern for poss blood clot.

EXAM:
RIGHT TIBIA AND FIBULA - 2 VIEW

[x tib-fib ap right (1 of 2)]
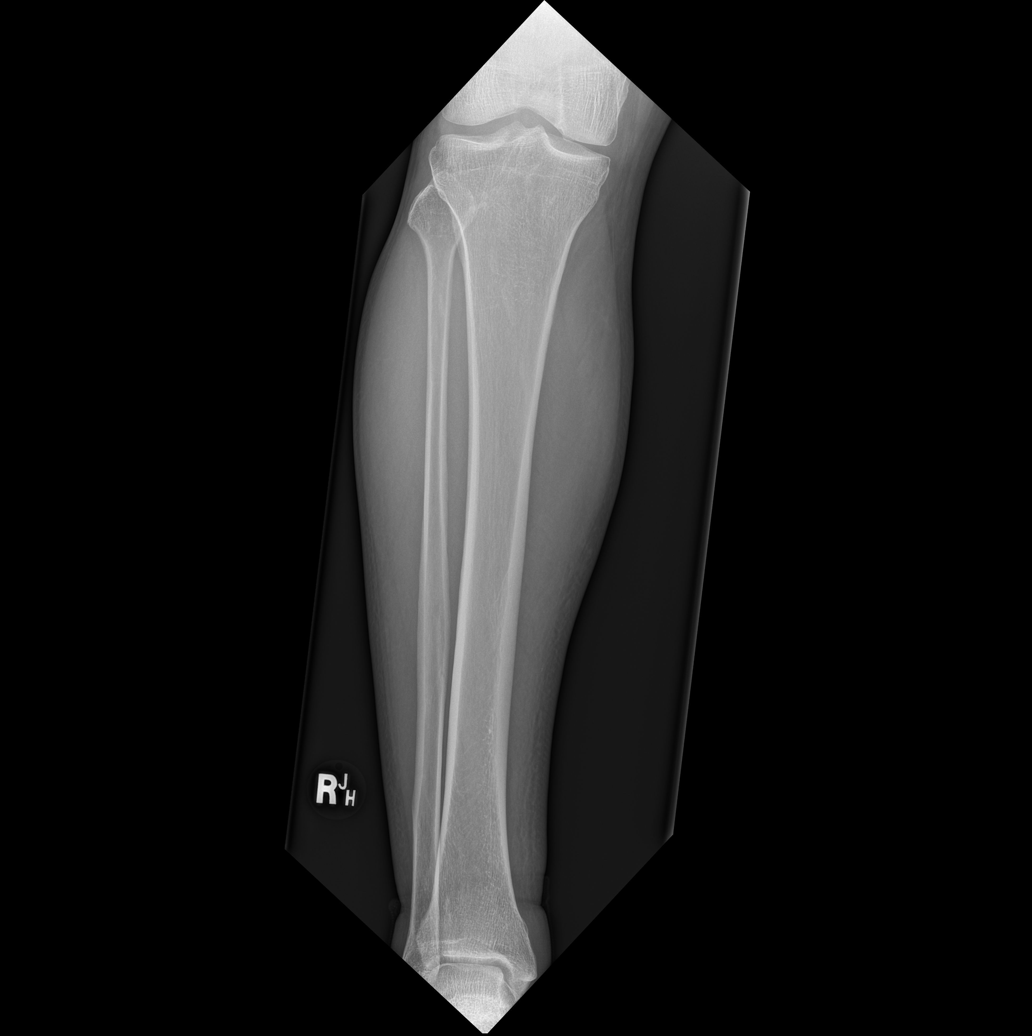

[x tib-fib ap right (2 of 2)]
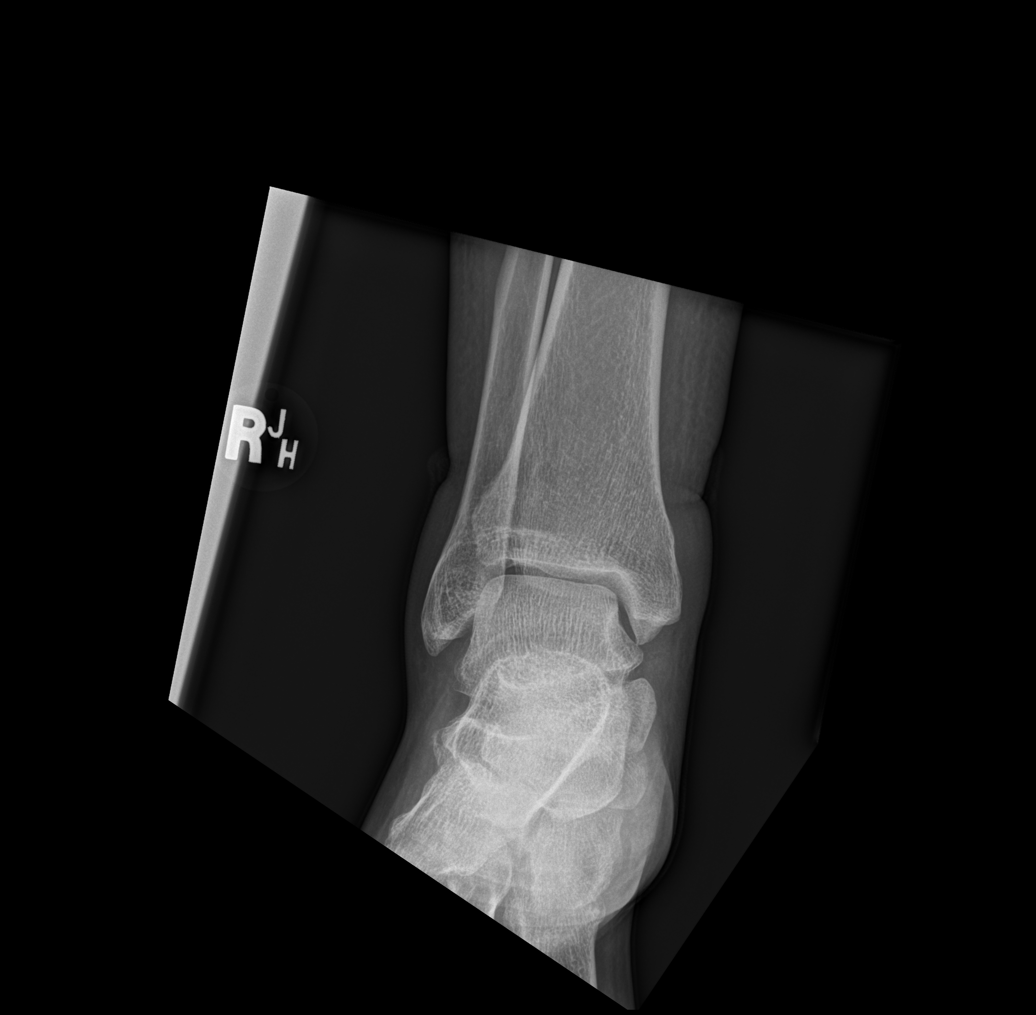

[x tib-fib lat right (1 of 2)]
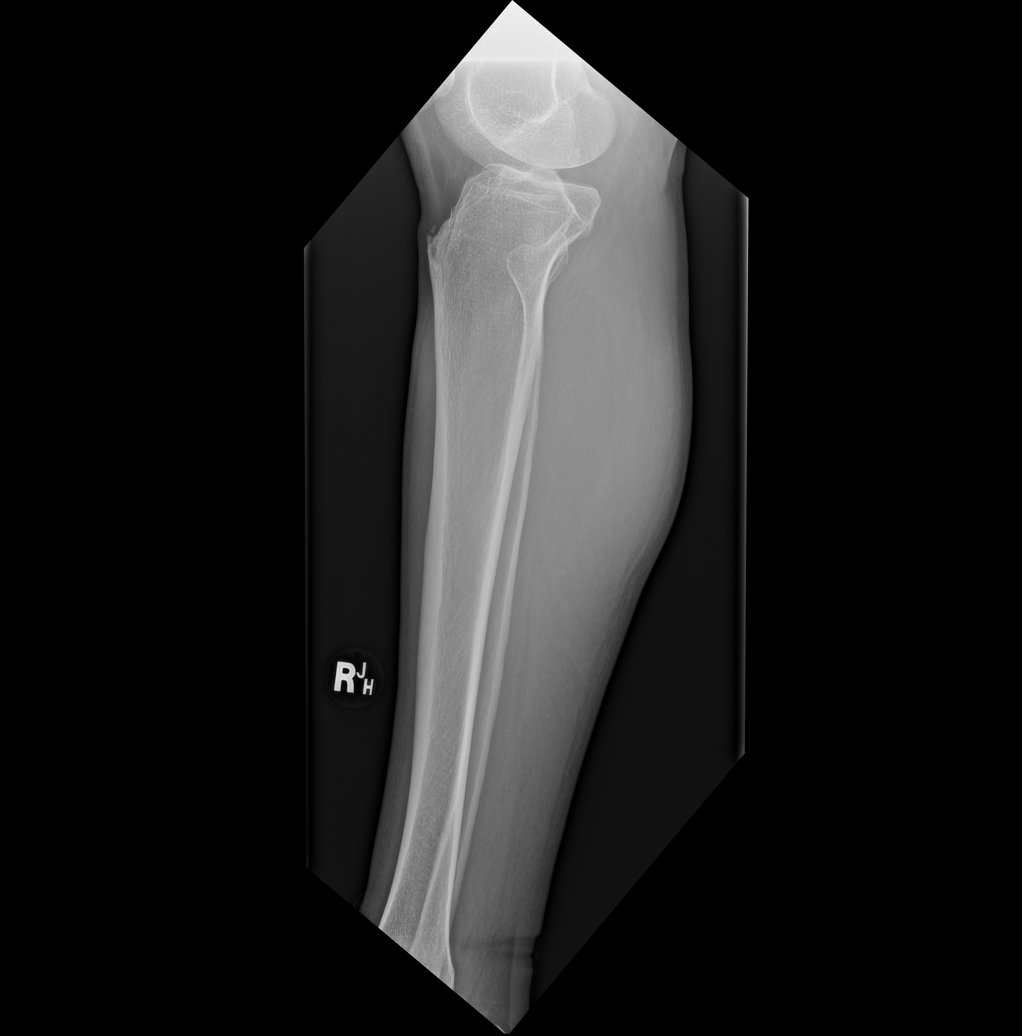

[x tib-fib lat right (2 of 2)]
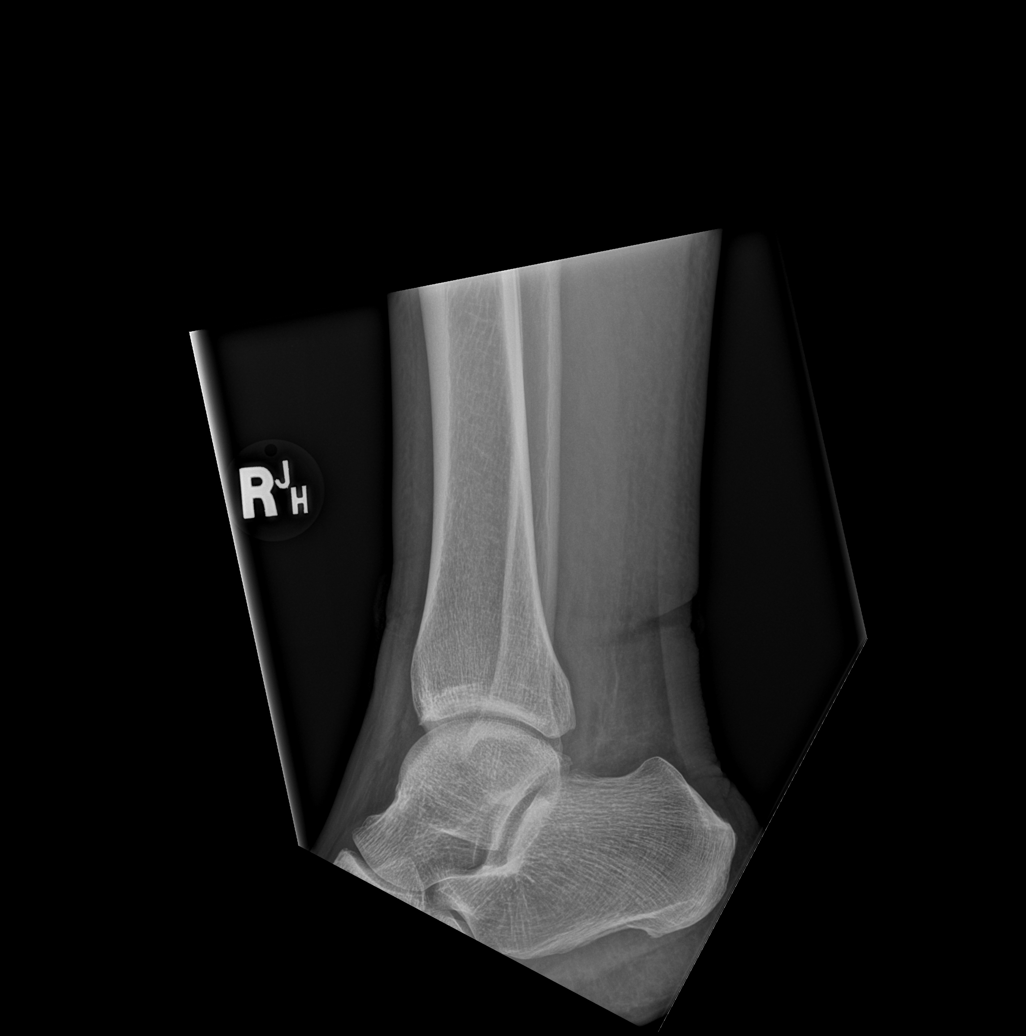

[4 of 4 positions shown; findings below may reference images not displayed]

FINDINGS: No fracture. No bone lesion. Knee and ankle joints are normally
spaced and aligned.

There is mild subcutaneous edema along the mid to lower leg.
IMPRESSION: No fracture or bone lesion.

## 2018-01-29 ENCOUNTER — Encounter: Payer: Self-pay | Admitting: Internal Medicine

## 2022-09-29 ENCOUNTER — Other Ambulatory Visit: Payer: Self-pay | Admitting: Radiation Oncology

## 2022-09-29 ENCOUNTER — Ambulatory Visit
Admission: RE | Admit: 2022-09-29 | Discharge: 2022-09-29 | Disposition: A | Discharge status: Home / Self Care | Payer: Self-pay | Source: Ambulatory Visit | Attending: Radiation Oncology | Admitting: Radiation Oncology

## 2022-09-29 DIAGNOSIS — C61 Malignant neoplasm of prostate: Secondary | ICD-10-CM

## 2022-09-30 NOTE — Progress Notes (Signed)
GU Location of Tumor / Histology: Prostate Ca  If Prostate Cancer, Gleason Score is (3 + 4) and PSA is (8.69 as of 09/2022)  Biopsies      Past/Anticipated interventions by urology, if any:  NA  Past/Anticipated interventions by medical oncology, if any:  NA  Weight changes, if any:   No  IPSS:   14 SHIM:   3  Bowel/Bladder complaints, if any:   No  Nausea/Vomiting, if any:   No  Pain issues, if any:  3/10 bilateral hips and lower back.  SAFETY ISSUES: Prior radiation?   No Pacemaker/ICD?  No Possible current pregnancy?  Male Is the patient on methotrexate?  No  Current Complaints / other details:  Need more information on treatment options.

## 2022-10-06 ENCOUNTER — Other Ambulatory Visit: Payer: Self-pay

## 2022-10-06 ENCOUNTER — Ambulatory Visit
Admission: RE | Admit: 2022-10-06 | Discharge: 2022-10-06 | Disposition: A | Discharge status: Home / Self Care | Payer: 59 | Source: Ambulatory Visit | Attending: Radiation Oncology | Admitting: Radiation Oncology

## 2022-10-06 VITALS — BP 159/78 | HR 66 | Temp 97.0°F | Resp 18 | Ht 73.0 in | Wt 241.1 lb

## 2022-10-06 DIAGNOSIS — K219 Gastro-esophageal reflux disease without esophagitis: Secondary | ICD-10-CM | POA: Diagnosis not present

## 2022-10-06 DIAGNOSIS — Z87891 Personal history of nicotine dependence: Secondary | ICD-10-CM | POA: Insufficient documentation

## 2022-10-06 DIAGNOSIS — E7800 Pure hypercholesterolemia, unspecified: Secondary | ICD-10-CM | POA: Diagnosis not present

## 2022-10-06 DIAGNOSIS — Z79899 Other long term (current) drug therapy: Secondary | ICD-10-CM | POA: Insufficient documentation

## 2022-10-06 DIAGNOSIS — C61 Malignant neoplasm of prostate: Secondary | ICD-10-CM | POA: Diagnosis present

## 2022-10-06 DIAGNOSIS — Z7902 Long term (current) use of antithrombotics/antiplatelets: Secondary | ICD-10-CM | POA: Diagnosis not present

## 2022-10-06 DIAGNOSIS — I10 Essential (primary) hypertension: Secondary | ICD-10-CM | POA: Diagnosis not present

## 2022-10-06 NOTE — Progress Notes (Signed)
Radiation Oncology         [phone] ________________________________  Initial Outpatient Consultation  Name: Joshua Little MRN: [id number]  Date: 10/06/2022  DOB: February 04, 1956  CC:No primary care provider on file.  [name], MD   REFERRING PHYSICIAN: [name], MD  DIAGNOSIS: 66 y.o. gentleman with Stage T1c adenocarcinoma of the prostate with Gleason score of 3+4, and PSA of 8.69.    ICD-10-CM   1. Malignant neoplasm of prostate ([place])  C61       HISTORY OF PRESENT ILLNESS: Joshua Little is a 66 y.o. male with a diagnosis of prostate cancer. He was noted to have an elevated PSA of 6.8 on 08/18/21.  Prostate MRI was on 02/13/22 revealing a PI-RADs 2 lesion.  Repeat PSA was 8.69 on Sep 26.  Accordingly, he was referred for evaluation in urology by Dr. Domenica Fail.  The patient proceeded to transrectal ultrasound with sextant biopsies of the prostate on 09/16/22.  The prostate volume measured 56 cc.  Out of 6 core biopsies, 6 were positive.  The maximum Gleason score was 3+4, and this was seen in Right Mid, Right Apex, and Left Apex with the remaining cores showing 3+3.  The patient reviewed the biopsy results with his urologist and he has kindly been referred today for discussion of potential radiation treatment options.   PREVIOUS RADIATION THERAPY: No  PAST MEDICAL HISTORY:  Past Medical History:  Diagnosis Date   Carpal tunnel syndrome    ED (erectile dysfunction)    GERD (gastroesophageal reflux disease)    Hypercholesterolemia    Hypertension       PAST SURGICAL HISTORY: Past Surgical History:  Procedure Laterality Date   TOTAL HIP REVISION  2008    total hip replacement   UMBILICAL HERNIA REPAIR     as a baby    FAMILY HISTORY:  Family History  Problem Relation Age of Onset   Gout Father    Diabetes Sister    Other Other        no history of colon cancer, prostate cancer, coronary artery disease    SOCIAL HISTORY:  Social History    Socioeconomic History   Marital status: Divorced    Spouse name: Not on file   Number of children: Not on file   Years of education: Not on file   Highest education level: Not on file  Occupational History   Not on file  Tobacco Use   Smoking status: Former   Smokeless tobacco: Never   Tobacco comments:    quit smoking in 2000-light smoker x 10 years  Substance and Sexual Activity   Alcohol use: Yes    Alcohol/week: 3.0 standard drinks of alcohol    Types: 3 drink(s) per week   Drug use: No   Sexual activity: Not on file  Other Topics Concern   Not on file  Social History Narrative   5 children - grown   Single -   Divorced   Occupation: Architectural technologist   quit smoking 2000 - light smoker x 10 yrs   Alcohol use-yes (3-4 drinks per week)   Occupation: Therapist, music for [employer]   Caffeine use/day:  3-4 times per week         Social Determinants of Health   Financial Resource Strain: Not on file  Food Insecurity: Not on file  Transportation Needs: Not on file  Physical Activity: Not on file  Stress: Not on file  Social Connections: Not on file  Intimate Partner Violence: Not on file    ALLERGIES: Patient has no known allergies.  MEDICATIONS:  Current Outpatient Medications  Medication Sig Dispense Refill   amLODipine (NORVASC) 10 MG tablet TAKE ONE TABLET BY MOUTH DAILY FOR BLOOD PRESSURE.     cilostazol (PLETAL) 100 MG tablet Take 1 tablet by mouth 2 (two) times daily.     doxazosin (CARDURA) 4 MG tablet Take 0.5 tablets by mouth at bedtime.     losartan (COZAAR) 100 MG tablet Take 1 tablet by mouth daily.     rosuvastatin (CRESTOR) 20 MG tablet TAKE ONE-HALF TABLET BY MOUTH ONCE A DAY FOR CHOLESTEROL     sildenafil (VIAGRA) 100 MG tablet TAKE ONE TABLET BY MOUTH AS DIRECTED (TAKE 1 HOUR PRIOR TO SEXUAL ACTIVITY *DO NOT EXCEED 1 DOSE PER 24 HOUR PERIOD*)     No current facility-administered medications for this encounter.      REVIEW  OF SYSTEMS:  On review of systems, the patient reports that he is doing well overall. He denies any chest pain, shortness of breath, cough, fevers, chills, night sweats, unintended weight changes. He denies any bowel disturbances, and denies abdominal pain, nausea or vomiting. He denies any new musculoskeletal or joint aches or pains. His IPSS was Total Score: 14, indicating moderate urinary symptoms (Reference 0-7 mild, 8-19 moderate, 20-35 severe).  His SHIM: 13, indicating he has moderate erectile dysfunction (Reference - 22-25 None, 17-21 Mild, 8-16 Moderate, 1-7 Severe). A complete review of systems is obtained and is otherwise negative.     PHYSICAL EXAM:  Wt Readings from Last 3 Encounters:  10/06/22 241 lb 2 oz (109.4 kg)  04/09/14 239 lb 1.3 oz (108.4 kg)  01/22/14 234 lb 1.9 oz (106.2 kg)   Temp Readings from Last 3 Encounters:  10/06/22 (!) 97 F (36.1 C) (Temporal)  04/09/14 98.3 F (36.8 C) (Oral)  01/22/14 99.1 F (37.3 C) (Oral)   BP Readings from Last 3 Encounters:  10/06/22 (!) 159/78  04/09/14 (!) 144/90  01/22/14 124/88   Pulse Readings from Last 3 Encounters:  10/06/22 66  04/09/14 64  01/22/14 76   Pain Assessment Pain Score: 3  Pain Loc: Hip/10  In general this is a well appearing male in no acute distress. He's alert and oriented x4 and appropriate throughout the examination. Cardiopulmonary assessment is negative for acute distress, and he exhibits normal effort.     KPS = 100  100 - Normal; no complaints; no evidence of disease. 90   - Able to carry on normal activity; minor signs or symptoms of disease. 80   - Normal activity with effort; some signs or symptoms of disease. 31   - Cares for self; unable to carry on normal activity or to do active work. 60   - Requires occasional assistance, but is able to care for most of his personal needs. 50   - Requires considerable assistance and frequent medical care. 21   - Disabled; requires special care and  assistance. 20   - Severely disabled; hospital admission is indicated although death not imminent. 73   - Very sick; hospital admission necessary; active supportive treatment necessary. 10   - Moribund; fatal processes progressing rapidly. 0     - Dead  Karnofsky DA, Abelmann WH, Craver LS and Burchenal [facility] [phone]) The use of the nitrogen mustards in the palliative treatment of carcinoma: with particular reference to bronchogenic carcinoma Cancer 1 634-56  LABORATORY DATA:  Lab Results  Component Value  Date   WBC 3.8 (L) 04/13/2013   HGB 15.3 04/13/2013   HCT 43.9 04/13/2013   MCV 85.7 04/13/2013   PLT 218 04/13/2013   Lab Results  Component Value Date   NA 140 04/13/2013   K 4.2 04/13/2013   CL 107 04/13/2013   CO2 26 04/13/2013   Lab Results  Component Value Date   ALT 23 04/13/2013   AST 27 04/13/2013   ALKPHOS 70 04/13/2013   BILITOT 1.0 04/13/2013     RADIOGRAPHY: No results found.    IMPRESSION/PLAN: 1. 66 y.o. gentleman with Stage T1c adenocarcinoma of the prostate with Gleason score of 3+4, and PSA of 8.69. We discussed the patient's workup and outlined the nature of prostate cancer in this setting. The patient's T stage, Gleason's score, and PSA put him into the favorable intermediate risk group. Accordingly, he is eligible for a variety of potential treatment options including brachytherapy, 5.5-8 weeks of external radiation, or prostatectomy. We discussed the available radiation techniques, and focused on the details and logistics of delivery.  We discussed and outlined the risks, benefits, short and long-term effects associated with radiotherapy and compared and contrasted these with prostatectomy. We discussed the role of SpaceOAR gel in reducing the rectal toxicity associated with radiotherapy.   He appears to have a good understanding of his disease and our treatment recommendations which are of curative intent.  He was encouraged to ask questions that were answered  to his stated satisfaction.  At the conclusion of our conversation, the patient is interested in moving forward with IMRT.  We will proceed with CT simulation on 10/09/22.  We personally spent 60 minutes in this encounter including chart review, reviewing radiological studies, meeting face-to-face with the patient, entering orders and completing documentation.      Tyler Pita, MD  [facility]  Radiation Oncology Direct Dial: [phone]  Fax: [phone] [email or web address]  Skype  [email or web address]

## 2022-10-09 ENCOUNTER — Ambulatory Visit
Admission: RE | Admit: 2022-10-09 | Discharge: 2022-10-09 | Disposition: A | Discharge status: Home / Self Care | Payer: No Typology Code available for payment source | Source: Ambulatory Visit | Attending: Radiation Oncology | Admitting: Radiation Oncology

## 2022-10-09 DIAGNOSIS — C61 Malignant neoplasm of prostate: Secondary | ICD-10-CM | POA: Diagnosis not present

## 2022-10-09 DIAGNOSIS — Z510 Encounter for antineoplastic radiation therapy: Secondary | ICD-10-CM | POA: Diagnosis not present

## 2022-10-09 NOTE — Progress Notes (Signed)
  Radiation Oncology         [phone] ________________________________  Name: Joshua Little MRN: [id number]  Date: 10/09/2022  DOB: 1956-05-26  SIMULATION AND TREATMENT PLANNING NOTE    ICD-10-CM   1. Malignant neoplasm of prostate ([place])  C61       DIAGNOSIS:  66 y.o. gentleman with Stage T1c adenocarcinoma of the prostate with Gleason score of 3+4, and PSA of 8.69.  NARRATIVE:  The patient was brought to the [place].  Identity was confirmed.  All relevant records and images related to the planned course of therapy were reviewed.  The patient freely provided informed written consent to proceed with treatment after reviewing the details related to the planned course of therapy. The consent form was witnessed and verified by the simulation staff.  Then, the patient was set-up in a stable reproducible supine position for radiation therapy.  A vacuum lock pillow device was custom fabricated to position his legs in a reproducible immobilized position.  Then, supervised the performance of a urethrogram under sterile conditions to identify the prostatic apex.  CT images were obtained.  Surface markings were placed.  The CT images were loaded into the planning software.  Then the prostate target and avoidance structures including the rectum, bladder, bowel and hips were contoured.  Treatment planning then occurred.  The radiation prescription was entered and confirmed.  A total of one complex treatment devices was fabricated. I have requested : Intensity Modulated Radiotherapy (IMRT) is medically necessary for this case for the following reason:  Rectal sparing.  I have requested daily cone beam CT volumetric image gudiance to track gold fiducial posiitoning along with bladder and rectal filling, this is medically necessary to assure accurate positioning of high dose radiation.  PLAN:  The patient will receive 70 Gy in 28  fractions.  ________________________________  Sheral Apley Tammi Klippel, M.D.

## 2022-10-19 DIAGNOSIS — Z510 Encounter for antineoplastic radiation therapy: Secondary | ICD-10-CM | POA: Diagnosis not present

## 2022-10-22 ENCOUNTER — Ambulatory Visit
Admission: RE | Admit: 2022-10-22 | Discharge: 2022-10-22 | Disposition: A | Discharge status: Home / Self Care | Payer: No Typology Code available for payment source | Source: Ambulatory Visit | Attending: Radiation Oncology | Admitting: Radiation Oncology

## 2022-10-22 ENCOUNTER — Other Ambulatory Visit: Payer: Self-pay

## 2022-10-22 DIAGNOSIS — Z510 Encounter for antineoplastic radiation therapy: Secondary | ICD-10-CM | POA: Diagnosis not present

## 2022-10-22 LAB — RAD ONC ARIA SESSION SUMMARY
Course Elapsed Days: 0
Plan Fractions Treated to Date: 1
Plan Prescribed Dose Per Fraction: 2.5 Gy
Plan Total Fractions Prescribed: 28
Plan Total Prescribed Dose: 70 Gy
Reference Point Dosage Given to Date: 2.5 Gy
Reference Point Session Dosage Given: 2.5 Gy
Session Number: 1

## 2022-10-23 ENCOUNTER — Ambulatory Visit
Admission: RE | Admit: 2022-10-23 | Discharge: 2022-10-23 | Disposition: A | Discharge status: Home / Self Care | Payer: No Typology Code available for payment source | Source: Ambulatory Visit | Attending: Radiation Oncology | Admitting: Radiation Oncology

## 2022-10-23 ENCOUNTER — Other Ambulatory Visit: Payer: Self-pay

## 2022-10-23 DIAGNOSIS — C61 Malignant neoplasm of prostate: Secondary | ICD-10-CM | POA: Insufficient documentation

## 2022-10-23 DIAGNOSIS — Z510 Encounter for antineoplastic radiation therapy: Secondary | ICD-10-CM | POA: Diagnosis present

## 2022-10-23 LAB — RAD ONC ARIA SESSION SUMMARY
Course Elapsed Days: 1
Plan Fractions Treated to Date: 2
Plan Prescribed Dose Per Fraction: 2.5 Gy
Plan Total Fractions Prescribed: 28
Plan Total Prescribed Dose: 70 Gy
Reference Point Dosage Given to Date: 5 Gy
Reference Point Session Dosage Given: 2.5 Gy
Session Number: 2

## 2022-10-26 ENCOUNTER — Other Ambulatory Visit: Payer: Self-pay

## 2022-10-26 ENCOUNTER — Ambulatory Visit
Admission: RE | Admit: 2022-10-26 | Discharge: 2022-10-26 | Disposition: A | Discharge status: Home / Self Care | Payer: No Typology Code available for payment source | Source: Ambulatory Visit | Attending: Radiation Oncology | Admitting: Radiation Oncology

## 2022-10-26 DIAGNOSIS — Z510 Encounter for antineoplastic radiation therapy: Secondary | ICD-10-CM | POA: Diagnosis not present

## 2022-10-26 LAB — RAD ONC ARIA SESSION SUMMARY
Course Elapsed Days: 4
Plan Fractions Treated to Date: 3
Plan Prescribed Dose Per Fraction: 2.5 Gy
Plan Total Fractions Prescribed: 28
Plan Total Prescribed Dose: 70 Gy
Reference Point Dosage Given to Date: 7.5 Gy
Reference Point Session Dosage Given: 2.5 Gy
Session Number: 3

## 2022-10-27 ENCOUNTER — Other Ambulatory Visit: Payer: Self-pay

## 2022-10-27 ENCOUNTER — Ambulatory Visit
Admission: RE | Admit: 2022-10-27 | Discharge: 2022-10-27 | Disposition: A | Discharge status: Home / Self Care | Payer: No Typology Code available for payment source | Source: Ambulatory Visit | Attending: Radiation Oncology | Admitting: Radiation Oncology

## 2022-10-27 DIAGNOSIS — Z510 Encounter for antineoplastic radiation therapy: Secondary | ICD-10-CM | POA: Diagnosis not present

## 2022-10-27 LAB — RAD ONC ARIA SESSION SUMMARY
Course Elapsed Days: 5
Plan Fractions Treated to Date: 4
Plan Prescribed Dose Per Fraction: 2.5 Gy
Plan Total Fractions Prescribed: 28
Plan Total Prescribed Dose: 70 Gy
Reference Point Dosage Given to Date: 10 Gy
Reference Point Session Dosage Given: 2.5 Gy
Session Number: 4

## 2022-10-28 ENCOUNTER — Other Ambulatory Visit: Payer: Self-pay

## 2022-10-28 ENCOUNTER — Ambulatory Visit
Admission: RE | Admit: 2022-10-28 | Discharge: 2022-10-28 | Disposition: A | Discharge status: Home / Self Care | Payer: No Typology Code available for payment source | Source: Ambulatory Visit | Attending: Radiation Oncology | Admitting: Radiation Oncology

## 2022-10-28 DIAGNOSIS — Z510 Encounter for antineoplastic radiation therapy: Secondary | ICD-10-CM | POA: Diagnosis not present

## 2022-10-28 LAB — RAD ONC ARIA SESSION SUMMARY
Course Elapsed Days: 6
Plan Fractions Treated to Date: 5
Plan Prescribed Dose Per Fraction: 2.5 Gy
Plan Total Fractions Prescribed: 28
Plan Total Prescribed Dose: 70 Gy
Reference Point Dosage Given to Date: 12.5 Gy
Reference Point Session Dosage Given: 2.5 Gy
Session Number: 5

## 2022-10-29 ENCOUNTER — Ambulatory Visit
Admission: RE | Admit: 2022-10-29 | Discharge: 2022-10-29 | Disposition: A | Discharge status: Home / Self Care | Payer: No Typology Code available for payment source | Source: Ambulatory Visit | Attending: Radiation Oncology | Admitting: Radiation Oncology

## 2022-10-29 ENCOUNTER — Other Ambulatory Visit: Payer: Self-pay

## 2022-10-29 DIAGNOSIS — Z510 Encounter for antineoplastic radiation therapy: Secondary | ICD-10-CM | POA: Diagnosis not present

## 2022-10-29 LAB — RAD ONC ARIA SESSION SUMMARY
Course Elapsed Days: 7
Plan Fractions Treated to Date: 6
Plan Prescribed Dose Per Fraction: 2.5 Gy
Plan Total Fractions Prescribed: 28
Plan Total Prescribed Dose: 70 Gy
Reference Point Dosage Given to Date: 15 Gy
Reference Point Session Dosage Given: 2.5 Gy
Session Number: 6

## 2022-10-30 ENCOUNTER — Other Ambulatory Visit: Payer: Self-pay

## 2022-10-30 ENCOUNTER — Ambulatory Visit
Admission: RE | Admit: 2022-10-30 | Discharge: 2022-10-30 | Disposition: A | Discharge status: Home / Self Care | Payer: No Typology Code available for payment source | Source: Ambulatory Visit | Attending: Radiation Oncology | Admitting: Radiation Oncology

## 2022-10-30 DIAGNOSIS — Z510 Encounter for antineoplastic radiation therapy: Secondary | ICD-10-CM | POA: Diagnosis not present

## 2022-10-30 LAB — RAD ONC ARIA SESSION SUMMARY
Course Elapsed Days: 8
Plan Fractions Treated to Date: 7
Plan Prescribed Dose Per Fraction: 2.5 Gy
Plan Total Fractions Prescribed: 28
Plan Total Prescribed Dose: 70 Gy
Reference Point Dosage Given to Date: 17.5 Gy
Reference Point Session Dosage Given: 2.5 Gy
Session Number: 7

## 2022-11-02 ENCOUNTER — Ambulatory Visit
Admission: RE | Admit: 2022-11-02 | Discharge: 2022-11-02 | Disposition: A | Discharge status: Home / Self Care | Payer: No Typology Code available for payment source | Source: Ambulatory Visit | Attending: Radiation Oncology | Admitting: Radiation Oncology

## 2022-11-02 ENCOUNTER — Other Ambulatory Visit: Payer: Self-pay

## 2022-11-02 DIAGNOSIS — Z510 Encounter for antineoplastic radiation therapy: Secondary | ICD-10-CM | POA: Diagnosis not present

## 2022-11-02 LAB — RAD ONC ARIA SESSION SUMMARY
Course Elapsed Days: 11
Plan Fractions Treated to Date: 8
Plan Prescribed Dose Per Fraction: 2.5 Gy
Plan Total Fractions Prescribed: 28
Plan Total Prescribed Dose: 70 Gy
Reference Point Dosage Given to Date: 20 Gy
Reference Point Session Dosage Given: 2.5 Gy
Session Number: 8

## 2022-11-03 ENCOUNTER — Ambulatory Visit
Admission: RE | Admit: 2022-11-03 | Discharge: 2022-11-03 | Disposition: A | Discharge status: Home / Self Care | Payer: No Typology Code available for payment source | Source: Ambulatory Visit | Attending: Radiation Oncology | Admitting: Radiation Oncology

## 2022-11-03 ENCOUNTER — Other Ambulatory Visit: Payer: Self-pay

## 2022-11-03 DIAGNOSIS — Z510 Encounter for antineoplastic radiation therapy: Secondary | ICD-10-CM | POA: Diagnosis not present

## 2022-11-03 LAB — RAD ONC ARIA SESSION SUMMARY
Course Elapsed Days: 12
Plan Fractions Treated to Date: 9
Plan Prescribed Dose Per Fraction: 2.5 Gy
Plan Total Fractions Prescribed: 28
Plan Total Prescribed Dose: 70 Gy
Reference Point Dosage Given to Date: 22.5 Gy
Reference Point Session Dosage Given: 2.5 Gy
Session Number: 9

## 2022-11-04 ENCOUNTER — Other Ambulatory Visit: Payer: Self-pay

## 2022-11-04 ENCOUNTER — Ambulatory Visit
Admission: RE | Admit: 2022-11-04 | Discharge: 2022-11-04 | Disposition: A | Discharge status: Home / Self Care | Payer: No Typology Code available for payment source | Source: Ambulatory Visit | Attending: Radiation Oncology | Admitting: Radiation Oncology

## 2022-11-04 DIAGNOSIS — Z510 Encounter for antineoplastic radiation therapy: Secondary | ICD-10-CM | POA: Diagnosis not present

## 2022-11-04 LAB — RAD ONC ARIA SESSION SUMMARY
Course Elapsed Days: 13
Plan Fractions Treated to Date: 10
Plan Prescribed Dose Per Fraction: 2.5 Gy
Plan Total Fractions Prescribed: 28
Plan Total Prescribed Dose: 70 Gy
Reference Point Dosage Given to Date: 25 Gy
Reference Point Session Dosage Given: 2.5 Gy
Session Number: 10

## 2022-11-05 ENCOUNTER — Other Ambulatory Visit: Payer: Self-pay

## 2022-11-05 ENCOUNTER — Ambulatory Visit
Admission: RE | Admit: 2022-11-05 | Discharge: 2022-11-05 | Disposition: A | Discharge status: Home / Self Care | Payer: No Typology Code available for payment source | Source: Ambulatory Visit | Attending: Radiation Oncology | Admitting: Radiation Oncology

## 2022-11-05 DIAGNOSIS — Z510 Encounter for antineoplastic radiation therapy: Secondary | ICD-10-CM | POA: Diagnosis not present

## 2022-11-05 LAB — RAD ONC ARIA SESSION SUMMARY
Course Elapsed Days: 14
Plan Fractions Treated to Date: 11
Plan Prescribed Dose Per Fraction: 2.5 Gy
Plan Total Fractions Prescribed: 28
Plan Total Prescribed Dose: 70 Gy
Reference Point Dosage Given to Date: 27.5 Gy
Reference Point Session Dosage Given: 2.5 Gy
Session Number: 11

## 2022-11-06 ENCOUNTER — Ambulatory Visit
Admission: RE | Admit: 2022-11-06 | Discharge: 2022-11-06 | Disposition: A | Discharge status: Home / Self Care | Payer: No Typology Code available for payment source | Source: Ambulatory Visit | Attending: Radiation Oncology | Admitting: Radiation Oncology

## 2022-11-06 ENCOUNTER — Other Ambulatory Visit: Payer: Self-pay

## 2022-11-06 DIAGNOSIS — Z510 Encounter for antineoplastic radiation therapy: Secondary | ICD-10-CM | POA: Diagnosis not present

## 2022-11-06 LAB — RAD ONC ARIA SESSION SUMMARY
Course Elapsed Days: 15
Plan Fractions Treated to Date: 12
Plan Prescribed Dose Per Fraction: 2.5 Gy
Plan Total Fractions Prescribed: 28
Plan Total Prescribed Dose: 70 Gy
Reference Point Dosage Given to Date: 30 Gy
Reference Point Session Dosage Given: 2.5 Gy
Session Number: 12

## 2022-11-09 ENCOUNTER — Ambulatory Visit
Admission: RE | Admit: 2022-11-09 | Discharge: 2022-11-09 | Disposition: A | Discharge status: Home / Self Care | Payer: No Typology Code available for payment source | Source: Ambulatory Visit | Attending: Radiation Oncology | Admitting: Radiation Oncology

## 2022-11-09 ENCOUNTER — Other Ambulatory Visit: Payer: Self-pay

## 2022-11-09 DIAGNOSIS — Z510 Encounter for antineoplastic radiation therapy: Secondary | ICD-10-CM | POA: Diagnosis not present

## 2022-11-09 LAB — RAD ONC ARIA SESSION SUMMARY
Course Elapsed Days: 18
Plan Fractions Treated to Date: 13
Plan Prescribed Dose Per Fraction: 2.5 Gy
Plan Total Fractions Prescribed: 28
Plan Total Prescribed Dose: 70 Gy
Reference Point Dosage Given to Date: 32.5 Gy
Reference Point Session Dosage Given: 2.5 Gy
Session Number: 13

## 2022-11-10 ENCOUNTER — Ambulatory Visit
Admission: RE | Admit: 2022-11-10 | Discharge: 2022-11-10 | Disposition: A | Discharge status: Home / Self Care | Payer: No Typology Code available for payment source | Source: Ambulatory Visit | Attending: Radiation Oncology | Admitting: Radiation Oncology

## 2022-11-10 ENCOUNTER — Other Ambulatory Visit: Payer: Self-pay

## 2022-11-10 DIAGNOSIS — Z510 Encounter for antineoplastic radiation therapy: Secondary | ICD-10-CM | POA: Diagnosis not present

## 2022-11-10 LAB — RAD ONC ARIA SESSION SUMMARY
Course Elapsed Days: 19
Plan Fractions Treated to Date: 14
Plan Prescribed Dose Per Fraction: 2.5 Gy
Plan Total Fractions Prescribed: 28
Plan Total Prescribed Dose: 70 Gy
Reference Point Dosage Given to Date: 35 Gy
Reference Point Session Dosage Given: 2.5 Gy
Session Number: 14

## 2022-11-11 ENCOUNTER — Ambulatory Visit
Admission: RE | Admit: 2022-11-11 | Discharge: 2022-11-11 | Disposition: A | Discharge status: Home / Self Care | Payer: No Typology Code available for payment source | Source: Ambulatory Visit | Attending: Radiation Oncology | Admitting: Radiation Oncology

## 2022-11-11 ENCOUNTER — Other Ambulatory Visit: Payer: Self-pay

## 2022-11-11 DIAGNOSIS — Z510 Encounter for antineoplastic radiation therapy: Secondary | ICD-10-CM | POA: Diagnosis not present

## 2022-11-11 LAB — RAD ONC ARIA SESSION SUMMARY
Course Elapsed Days: 20
Plan Fractions Treated to Date: 15
Plan Prescribed Dose Per Fraction: 2.5 Gy
Plan Total Fractions Prescribed: 28
Plan Total Prescribed Dose: 70 Gy
Reference Point Dosage Given to Date: 37.5 Gy
Reference Point Session Dosage Given: 2.5 Gy
Session Number: 15

## 2022-11-12 ENCOUNTER — Other Ambulatory Visit: Payer: Self-pay

## 2022-11-12 ENCOUNTER — Ambulatory Visit
Admission: RE | Admit: 2022-11-12 | Discharge: 2022-11-12 | Disposition: A | Discharge status: Home / Self Care | Payer: No Typology Code available for payment source | Source: Ambulatory Visit | Attending: Radiation Oncology | Admitting: Radiation Oncology

## 2022-11-12 DIAGNOSIS — Z510 Encounter for antineoplastic radiation therapy: Secondary | ICD-10-CM | POA: Diagnosis not present

## 2022-11-12 LAB — RAD ONC ARIA SESSION SUMMARY
Course Elapsed Days: 21
Plan Fractions Treated to Date: 16
Plan Prescribed Dose Per Fraction: 2.5 Gy
Plan Total Fractions Prescribed: 28
Plan Total Prescribed Dose: 70 Gy
Reference Point Dosage Given to Date: 40 Gy
Reference Point Session Dosage Given: 2.5 Gy
Session Number: 16

## 2022-11-13 ENCOUNTER — Ambulatory Visit
Admission: RE | Admit: 2022-11-13 | Discharge: 2022-11-13 | Disposition: A | Discharge status: Home / Self Care | Payer: No Typology Code available for payment source | Source: Ambulatory Visit | Attending: Radiation Oncology | Admitting: Radiation Oncology

## 2022-11-13 ENCOUNTER — Other Ambulatory Visit: Payer: Self-pay

## 2022-11-13 DIAGNOSIS — Z510 Encounter for antineoplastic radiation therapy: Secondary | ICD-10-CM | POA: Diagnosis not present

## 2022-11-13 LAB — RAD ONC ARIA SESSION SUMMARY
Course Elapsed Days: 22
Plan Fractions Treated to Date: 17
Plan Prescribed Dose Per Fraction: 2.5 Gy
Plan Total Fractions Prescribed: 28
Plan Total Prescribed Dose: 70 Gy
Reference Point Dosage Given to Date: 42.5 Gy
Reference Point Session Dosage Given: 2.5 Gy
Session Number: 17

## 2022-11-17 ENCOUNTER — Ambulatory Visit
Admission: RE | Admit: 2022-11-17 | Discharge: 2022-11-17 | Disposition: A | Discharge status: Home / Self Care | Payer: No Typology Code available for payment source | Source: Ambulatory Visit | Attending: Radiation Oncology | Admitting: Radiation Oncology

## 2022-11-17 ENCOUNTER — Other Ambulatory Visit: Payer: Self-pay

## 2022-11-17 DIAGNOSIS — Z510 Encounter for antineoplastic radiation therapy: Secondary | ICD-10-CM | POA: Diagnosis not present

## 2022-11-17 LAB — RAD ONC ARIA SESSION SUMMARY
Course Elapsed Days: 26
Plan Fractions Treated to Date: 18
Plan Prescribed Dose Per Fraction: 2.5 Gy
Plan Total Fractions Prescribed: 28
Plan Total Prescribed Dose: 70 Gy
Reference Point Dosage Given to Date: 45 Gy
Reference Point Session Dosage Given: 2.5 Gy
Session Number: 18

## 2022-11-18 ENCOUNTER — Other Ambulatory Visit: Payer: Self-pay

## 2022-11-18 ENCOUNTER — Ambulatory Visit
Admission: RE | Admit: 2022-11-18 | Discharge: 2022-11-18 | Disposition: A | Discharge status: Home / Self Care | Payer: No Typology Code available for payment source | Source: Ambulatory Visit | Attending: Radiation Oncology | Admitting: Radiation Oncology

## 2022-11-18 DIAGNOSIS — Z510 Encounter for antineoplastic radiation therapy: Secondary | ICD-10-CM | POA: Diagnosis not present

## 2022-11-18 LAB — RAD ONC ARIA SESSION SUMMARY
Course Elapsed Days: 27
Plan Fractions Treated to Date: 19
Plan Prescribed Dose Per Fraction: 2.5 Gy
Plan Total Fractions Prescribed: 28
Plan Total Prescribed Dose: 70 Gy
Reference Point Dosage Given to Date: 47.5 Gy
Reference Point Session Dosage Given: 2.5 Gy
Session Number: 19

## 2022-11-19 ENCOUNTER — Other Ambulatory Visit: Payer: Self-pay

## 2022-11-19 ENCOUNTER — Ambulatory Visit
Admission: RE | Admit: 2022-11-19 | Discharge: 2022-11-19 | Disposition: A | Discharge status: Home / Self Care | Payer: No Typology Code available for payment source | Source: Ambulatory Visit | Attending: Radiation Oncology | Admitting: Radiation Oncology

## 2022-11-19 DIAGNOSIS — Z510 Encounter for antineoplastic radiation therapy: Secondary | ICD-10-CM | POA: Diagnosis not present

## 2022-11-19 LAB — RAD ONC ARIA SESSION SUMMARY
Course Elapsed Days: 28
Plan Fractions Treated to Date: 20
Plan Prescribed Dose Per Fraction: 2.5 Gy
Plan Total Fractions Prescribed: 28
Plan Total Prescribed Dose: 70 Gy
Reference Point Dosage Given to Date: 50 Gy
Reference Point Session Dosage Given: 2.5 Gy
Session Number: 20

## 2022-11-20 ENCOUNTER — Ambulatory Visit
Admission: RE | Admit: 2022-11-20 | Discharge: 2022-11-20 | Disposition: A | Discharge status: Home / Self Care | Payer: No Typology Code available for payment source | Source: Ambulatory Visit | Attending: Radiation Oncology | Admitting: Radiation Oncology

## 2022-11-20 ENCOUNTER — Other Ambulatory Visit: Payer: Self-pay

## 2022-11-20 DIAGNOSIS — Z510 Encounter for antineoplastic radiation therapy: Secondary | ICD-10-CM | POA: Diagnosis not present

## 2022-11-20 LAB — RAD ONC ARIA SESSION SUMMARY
Course Elapsed Days: 29
Plan Fractions Treated to Date: 21
Plan Prescribed Dose Per Fraction: 2.5 Gy
Plan Total Fractions Prescribed: 28
Plan Total Prescribed Dose: 70 Gy
Reference Point Dosage Given to Date: 52.5 Gy
Reference Point Session Dosage Given: 2.5 Gy
Session Number: 21

## 2022-11-24 ENCOUNTER — Other Ambulatory Visit: Payer: Self-pay

## 2022-11-24 ENCOUNTER — Ambulatory Visit
Admission: RE | Admit: 2022-11-24 | Discharge: 2022-11-24 | Disposition: A | Discharge status: Home / Self Care | Payer: No Typology Code available for payment source | Source: Ambulatory Visit | Attending: Radiation Oncology | Admitting: Radiation Oncology

## 2022-11-24 DIAGNOSIS — Z510 Encounter for antineoplastic radiation therapy: Secondary | ICD-10-CM | POA: Insufficient documentation

## 2022-11-24 DIAGNOSIS — C61 Malignant neoplasm of prostate: Secondary | ICD-10-CM | POA: Diagnosis not present

## 2022-11-24 LAB — RAD ONC ARIA SESSION SUMMARY
Course Elapsed Days: 33
Plan Fractions Treated to Date: 22
Plan Prescribed Dose Per Fraction: 2.5 Gy
Plan Total Fractions Prescribed: 28
Plan Total Prescribed Dose: 70 Gy
Reference Point Dosage Given to Date: 55 Gy
Reference Point Session Dosage Given: 2.5 Gy
Session Number: 22

## 2022-11-25 ENCOUNTER — Other Ambulatory Visit: Payer: Self-pay

## 2022-11-25 ENCOUNTER — Ambulatory Visit
Admission: RE | Admit: 2022-11-25 | Discharge: 2022-11-25 | Disposition: A | Discharge status: Home / Self Care | Payer: No Typology Code available for payment source | Source: Ambulatory Visit | Attending: Radiation Oncology | Admitting: Radiation Oncology

## 2022-11-25 DIAGNOSIS — Z510 Encounter for antineoplastic radiation therapy: Secondary | ICD-10-CM | POA: Diagnosis not present

## 2022-11-25 LAB — RAD ONC ARIA SESSION SUMMARY
Course Elapsed Days: 34
Plan Fractions Treated to Date: 23
Plan Prescribed Dose Per Fraction: 2.5 Gy
Plan Total Fractions Prescribed: 28
Plan Total Prescribed Dose: 70 Gy
Reference Point Dosage Given to Date: 57.5 Gy
Reference Point Session Dosage Given: 2.5 Gy
Session Number: 23

## 2022-11-26 ENCOUNTER — Telehealth: Payer: Self-pay | Admitting: *Deleted

## 2022-11-26 ENCOUNTER — Other Ambulatory Visit: Payer: Self-pay

## 2022-11-26 ENCOUNTER — Ambulatory Visit: Admission: RE | Admit: 2022-11-26 | Payer: No Typology Code available for payment source | Source: Ambulatory Visit

## 2022-11-26 ENCOUNTER — Ambulatory Visit
Admission: RE | Admit: 2022-11-26 | Discharge: 2022-11-26 | Disposition: A | Discharge status: Home / Self Care | Payer: No Typology Code available for payment source | Source: Ambulatory Visit | Attending: Radiation Oncology | Admitting: Radiation Oncology

## 2022-11-26 DIAGNOSIS — Z510 Encounter for antineoplastic radiation therapy: Secondary | ICD-10-CM | POA: Diagnosis not present

## 2022-11-26 LAB — RAD ONC ARIA SESSION SUMMARY
Course Elapsed Days: 35
Plan Fractions Treated to Date: 24
Plan Prescribed Dose Per Fraction: 2.5 Gy
Plan Total Fractions Prescribed: 28
Plan Total Prescribed Dose: 70 Gy
Reference Point Dosage Given to Date: 60 Gy
Reference Point Session Dosage Given: 2.5 Gy
Session Number: 24

## 2022-11-26 NOTE — Telephone Encounter (Signed)
Voicemail retrieved from [employer], [phone]).  "Just checking to see if [facility] sent My paperwork over because my job has not received it yet.  Call my number."  Currently, no paperwork received or logged in [facility] Excel forms tracker for this patient.

## 2022-11-27 ENCOUNTER — Ambulatory Visit
Admission: RE | Admit: 2022-11-27 | Discharge: 2022-11-27 | Disposition: A | Discharge status: Home / Self Care | Payer: No Typology Code available for payment source | Source: Ambulatory Visit | Attending: Radiation Oncology | Admitting: Radiation Oncology

## 2022-11-27 ENCOUNTER — Other Ambulatory Visit: Payer: Self-pay

## 2022-11-27 DIAGNOSIS — Z510 Encounter for antineoplastic radiation therapy: Secondary | ICD-10-CM | POA: Diagnosis not present

## 2022-11-27 LAB — RAD ONC ARIA SESSION SUMMARY
Course Elapsed Days: 36
Plan Fractions Treated to Date: 25
Plan Prescribed Dose Per Fraction: 2.5 Gy
Plan Total Fractions Prescribed: 28
Plan Total Prescribed Dose: 70 Gy
Reference Point Dosage Given to Date: 62.5 Gy
Reference Point Session Dosage Given: 2.5 Gy
Session Number: 25

## 2022-11-27 NOTE — Telephone Encounter (Signed)
Connected with CAROLL CUNNINGTON [phone]).  Informed we have not received paperwork. "The [facility] lost it.  Leave started end of November. I have three treatments left but employer needs form.  Will bring paperwork and [facility] in on Monday, 11/30/2022." Currently no further questions or needs.

## 2022-11-30 ENCOUNTER — Other Ambulatory Visit: Payer: Self-pay

## 2022-11-30 ENCOUNTER — Ambulatory Visit
Admission: RE | Admit: 2022-11-30 | Discharge: 2022-11-30 | Disposition: A | Discharge status: Home / Self Care | Payer: No Typology Code available for payment source | Source: Ambulatory Visit | Attending: Radiation Oncology | Admitting: Radiation Oncology

## 2022-11-30 DIAGNOSIS — Z510 Encounter for antineoplastic radiation therapy: Secondary | ICD-10-CM | POA: Diagnosis not present

## 2022-11-30 LAB — RAD ONC ARIA SESSION SUMMARY
Course Elapsed Days: 39
Plan Fractions Treated to Date: 26
Plan Prescribed Dose Per Fraction: 2.5 Gy
Plan Total Fractions Prescribed: 28
Plan Total Prescribed Dose: 70 Gy
Reference Point Dosage Given to Date: 65 Gy
Reference Point Session Dosage Given: 2.5 Gy
Session Number: 26

## 2022-12-01 ENCOUNTER — Ambulatory Visit
Admission: RE | Admit: 2022-12-01 | Discharge: 2022-12-01 | Disposition: A | Discharge status: Home / Self Care | Payer: No Typology Code available for payment source | Source: Ambulatory Visit | Attending: Radiation Oncology | Admitting: Radiation Oncology

## 2022-12-01 ENCOUNTER — Other Ambulatory Visit: Payer: Self-pay

## 2022-12-01 DIAGNOSIS — Z510 Encounter for antineoplastic radiation therapy: Secondary | ICD-10-CM | POA: Diagnosis not present

## 2022-12-01 LAB — RAD ONC ARIA SESSION SUMMARY
Course Elapsed Days: 40
Plan Fractions Treated to Date: 27
Plan Prescribed Dose Per Fraction: 2.5 Gy
Plan Total Fractions Prescribed: 28
Plan Total Prescribed Dose: 70 Gy
Reference Point Dosage Given to Date: 67.5 Gy
Reference Point Session Dosage Given: 2.5 Gy
Session Number: 27

## 2022-12-01 NOTE — Telephone Encounter (Signed)
Paperwork completed 11/30/2022 to provider & PA for review, signature and return.      Today, connected with RICHAR DUNKLEE [phone]) to obtain fax number, clarify return to work request and if intermittent leave needed upon return to work.      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  Feeling a little exhausted, experienced a little constipation, otherwise no other treatment side effects.  Need an accommodation to not be on call for at least two weeks upon return to work.  Need intermittent leave for future appointments.  Was not provided a fax number, when can I pick this up to return to employer?"        Upon receipt from provider, this nurse will call when ready for pick up.      Currently no further questions or needs.

## 2022-12-02 ENCOUNTER — Ambulatory Visit
Admission: RE | Admit: 2022-12-02 | Discharge: 2022-12-02 | Disposition: A | Discharge status: Home / Self Care | Payer: No Typology Code available for payment source | Source: Ambulatory Visit | Attending: Radiation Oncology | Admitting: Radiation Oncology

## 2022-12-02 ENCOUNTER — Other Ambulatory Visit: Payer: Self-pay

## 2022-12-02 ENCOUNTER — Encounter: Payer: Self-pay | Admitting: Urology

## 2022-12-02 ENCOUNTER — Ambulatory Visit: Payer: No Typology Code available for payment source

## 2022-12-02 DIAGNOSIS — Z510 Encounter for antineoplastic radiation therapy: Secondary | ICD-10-CM | POA: Diagnosis not present

## 2022-12-02 LAB — RAD ONC ARIA SESSION SUMMARY
Course Elapsed Days: 41
Plan Fractions Treated to Date: 28
Plan Prescribed Dose Per Fraction: 2.5 Gy
Plan Total Fractions Prescribed: 28
Plan Total Prescribed Dose: 70 Gy
Reference Point Dosage Given to Date: 70 Gy
Reference Point Session Dosage Given: 2.5 Gy
Session Number: 28

## 2022-12-02 NOTE — Telephone Encounter (Signed)
Paperwork returned and received today by this nurse from provider.  Connected with Joshua Little [phone]).  Advised paperwork ready for pick up for his return to employer, [employer].  "Will pickup later this afternoon."  Envelope to [facility] entry receptionist desk.  Copy to Radiation for items to be scanned to EMR completes process.  No further instructions received or activities performed by this nurse.

## 2023-01-08 ENCOUNTER — Other Ambulatory Visit: Payer: Self-pay | Admitting: Urology

## 2023-01-08 DIAGNOSIS — C61 Malignant neoplasm of prostate: Secondary | ICD-10-CM

## 2023-01-08 NOTE — Progress Notes (Signed)
                                                                                                                                                             Patient Name: Joshua Little MRN: [id number] DOB: 1956-03-03 Referring Physician: [name] Date of Service: 12/02/2022 [facility] [place]                                                        End Of Treatment Note  Diagnoses: C61-Malignant neoplasm of prostate  Cancer Staging: 67 y.o. gentleman with Stage T1c adenocarcinoma of the prostate with Gleason score of 3+4, and PSA of 8.69.   Intent: Curative  Radiation Treatment Dates: 10/22/2022 through 12/02/2022 Site Technique Total Dose (Gy) Dose per Fx (Gy) Completed Fx Beam Energies  Prostate: Prostate IMRT 70/70 2.5 28/28 6X   Narrative: The patient tolerated radiation therapy relatively well with modest fatigue, mild constipation and nocturia x 3.  Plan: The patient will receive a call in about one month from the radiation oncology department. He will continue follow up with his urologist at the [facility], Dr. Domenica Fail, as well.  ------------------------------------------------   Tyler Pita, MD [place]: [phone]  Fax: [phone] [email or web address]  Skype  LinkedIn

## 2023-01-12 ENCOUNTER — Ambulatory Visit
Admission: RE | Admit: 2023-01-12 | Discharge: 2023-01-12 | Disposition: A | Discharge status: Home / Self Care | Payer: No Typology Code available for payment source | Source: Ambulatory Visit | Attending: Radiation Oncology | Admitting: Radiation Oncology

## 2023-01-12 DIAGNOSIS — Z510 Encounter for antineoplastic radiation therapy: Secondary | ICD-10-CM | POA: Insufficient documentation

## 2023-01-12 DIAGNOSIS — C61 Malignant neoplasm of prostate: Secondary | ICD-10-CM | POA: Insufficient documentation

## 2023-01-12 NOTE — Progress Notes (Signed)
  Radiation Oncology         [phone] ________________________________  Name: Joshua Little MRN: [id number]  Date of Service: 01/12/2023  DOB: June 10, 1956  Post Treatment Telephone Note  Diagnosis:  67 y.o. gentleman with Stage T1c adenocarcinoma of the prostate with Gleason score of 3+4, and PSA of 8.69.   Intent: Curative  Radiation Treatment Dates: 10/22/2022 through 12/02/2022 Site Technique Total Dose (Gy) Dose per Fx (Gy) Completed Fx Beam Energies  Prostate: Prostate IMRT 70/70 2.5 28/28 6X   (as documented in provider EOT note)   Pre Treatment IPSS Score: 14 (as documented in the provider consult note)   The patient was available for call today.   Symptoms of fatigue have improved since completing therapy.  Symptoms of bladder changes have  improved since completing therapy. Current symptoms include Polyuria, and medications for bladder symptoms include Tamsulosin.  Symptoms of bowel changes have improved since completing therapy. Current symptoms include mild constipation, and medications for bowel symptoms include none.     Post Treatment IPSS Score: IPSS Questionnaire (AUA-7): Over the past month.   1)  How often have you had a sensation of not emptying your bladder completely after you finish urinating?  0 - Not at all  2)  How often have you had to urinate again less than two hours after you finished urinating? 0 - Not at all  3)  How often have you found you stopped and started again several times when you urinated?  0 - Not at all  4) How difficult have you found it to postpone urination?  0 - Not at all  5) How often have you had a weak urinary stream?  2 - Less than half the time  6) How often have you had to push or strain to begin urination?  0 - Not at all  7) How many times did you most typically get up to urinate from the time you went to bed until the time you got up in the morning?  4 - 4 times  Total score:  6. Which indicates mild  symptoms  0-7 mildly symptomatic   8-19 moderately symptomatic   20-35 severely symptomatic    Patient will continue follow up with his urologist at the [facility], Dr. Domenica Fail for ongoing surveillance. He was counseled that PSA levels will be drawn in the urology office, and was reassured that additional time is expected to improve bowel and bladder symptoms. He was encouraged to call back with concerns or questions regarding radiation.   This concludes the interview.   Leandra Kern, LPN

## 2023-01-29 ENCOUNTER — Encounter: Payer: Self-pay | Admitting: *Deleted

## 2023-02-04 ENCOUNTER — Inpatient Hospital Stay: Payer: No Typology Code available for payment source | Attending: *Deleted | Admitting: *Deleted

## 2023-02-12 ENCOUNTER — Encounter: Payer: Self-pay | Admitting: *Deleted

## 2023-02-12 ENCOUNTER — Inpatient Hospital Stay: Payer: No Typology Code available for payment source | Admitting: *Deleted

## 2023-03-18 ENCOUNTER — Inpatient Hospital Stay: Payer: No Typology Code available for payment source | Attending: *Deleted | Admitting: *Deleted

## 2023-03-18 ENCOUNTER — Encounter: Payer: Self-pay | Admitting: *Deleted
